# Patient Record
Sex: Female | Born: 1950
Health system: Southern US, Community
[De-identification: ages and names within clinical notes are randomized; demographics above are authoritative.]

## PROBLEM LIST (undated history)

## (undated) DIAGNOSIS — R112 Nausea with vomiting, unspecified: Secondary | ICD-10-CM

## (undated) DIAGNOSIS — M1990 Unspecified osteoarthritis, unspecified site: Secondary | ICD-10-CM

## (undated) DIAGNOSIS — T8859XA Other complications of anesthesia, initial encounter: Secondary | ICD-10-CM

## (undated) DIAGNOSIS — G4700 Insomnia, unspecified: Secondary | ICD-10-CM

## (undated) DIAGNOSIS — R519 Headache, unspecified: Secondary | ICD-10-CM

## (undated) DIAGNOSIS — T4145XA Adverse effect of unspecified anesthetic, initial encounter: Secondary | ICD-10-CM

## (undated) DIAGNOSIS — C801 Malignant (primary) neoplasm, unspecified: Secondary | ICD-10-CM

## (undated) DIAGNOSIS — Z98811 Dental restoration status: Secondary | ICD-10-CM

## (undated) DIAGNOSIS — R51 Headache: Secondary | ICD-10-CM

## (undated) DIAGNOSIS — Z9889 Other specified postprocedural states: Secondary | ICD-10-CM

## (undated) DIAGNOSIS — E039 Hypothyroidism, unspecified: Secondary | ICD-10-CM

## (undated) HISTORY — PX: VAGINAL HYSTERECTOMY: SUR661

## (undated) HISTORY — PX: DIAGNOSTIC LAPAROSCOPY: SUR761

## (undated) HISTORY — PX: TONSILLECTOMY: SUR1361

## (undated) HISTORY — PX: BREAST SURGERY: SHX581

## (undated) HISTORY — PX: TUBAL LIGATION: SHX77

## (undated) HISTORY — PX: APPENDECTOMY: SHX54

---

## 1982-04-08 HISTORY — PX: CERVICAL CONE BIOPSY: SUR198

## 1983-04-09 HISTORY — PX: VEIN LIGATION AND STRIPPING: SHX2653

## 2004-01-28 ENCOUNTER — Ambulatory Visit: Payer: Self-pay | Admitting: Internal Medicine

## 2005-12-31 ENCOUNTER — Ambulatory Visit: Payer: Self-pay | Admitting: General Surgery

## 2006-04-08 HISTORY — PX: BREAST EXCISIONAL BIOPSY: SUR124

## 2008-05-19 ENCOUNTER — Emergency Department: Payer: Self-pay | Admitting: Emergency Medicine

## 2009-12-09 ENCOUNTER — Emergency Department: Payer: Self-pay | Admitting: Emergency Medicine

## 2011-09-25 ENCOUNTER — Ambulatory Visit: Payer: Self-pay | Admitting: Family Medicine

## 2012-11-03 ENCOUNTER — Ambulatory Visit: Payer: Self-pay | Admitting: Family Medicine

## 2014-02-11 ENCOUNTER — Ambulatory Visit: Payer: Self-pay | Admitting: Family Medicine

## 2015-01-03 ENCOUNTER — Encounter: Payer: Self-pay | Admitting: Family Medicine

## 2015-01-05 ENCOUNTER — Other Ambulatory Visit: Payer: Self-pay | Admitting: Internal Medicine

## 2015-01-05 DIAGNOSIS — Z1239 Encounter for other screening for malignant neoplasm of breast: Secondary | ICD-10-CM

## 2015-02-13 ENCOUNTER — Ambulatory Visit: Payer: Self-pay

## 2015-02-15 ENCOUNTER — Ambulatory Visit
Admission: RE | Admit: 2015-02-15 | Discharge: 2015-02-15 | Disposition: A | Discharge status: Home / Self Care | Payer: Medicare Other | Source: Ambulatory Visit | Attending: Internal Medicine | Admitting: Internal Medicine

## 2015-02-15 ENCOUNTER — Other Ambulatory Visit: Payer: Self-pay | Admitting: Internal Medicine

## 2015-02-15 DIAGNOSIS — Z1239 Encounter for other screening for malignant neoplasm of breast: Secondary | ICD-10-CM

## 2015-02-15 DIAGNOSIS — Z1231 Encounter for screening mammogram for malignant neoplasm of breast: Secondary | ICD-10-CM | POA: Diagnosis not present

## 2015-03-22 ENCOUNTER — Encounter: Payer: Self-pay | Admitting: *Deleted

## 2015-03-23 ENCOUNTER — Ambulatory Visit
Admission: RE | Admit: 2015-03-23 | Discharge: 2015-03-23 | Disposition: A | Discharge status: Home / Self Care | Payer: Medicare Other | Source: Ambulatory Visit | Attending: Gastroenterology | Admitting: Gastroenterology

## 2015-03-23 ENCOUNTER — Encounter: Payer: Self-pay | Admitting: *Deleted

## 2015-03-23 ENCOUNTER — Ambulatory Visit: Payer: Medicare Other | Admitting: Certified Registered Nurse Anesthetist

## 2015-03-23 ENCOUNTER — Encounter
Admission: RE | Disposition: A | Discharge status: Home / Self Care | Payer: Self-pay | Source: Ambulatory Visit | Attending: Gastroenterology

## 2015-03-23 DIAGNOSIS — Z7982 Long term (current) use of aspirin: Secondary | ICD-10-CM | POA: Insufficient documentation

## 2015-03-23 DIAGNOSIS — Z1211 Encounter for screening for malignant neoplasm of colon: Secondary | ICD-10-CM | POA: Diagnosis not present

## 2015-03-23 DIAGNOSIS — Z79899 Other long term (current) drug therapy: Secondary | ICD-10-CM | POA: Diagnosis not present

## 2015-03-23 DIAGNOSIS — E039 Hypothyroidism, unspecified: Secondary | ICD-10-CM | POA: Diagnosis not present

## 2015-03-23 DIAGNOSIS — Z8371 Family history of colonic polyps: Secondary | ICD-10-CM | POA: Diagnosis not present

## 2015-03-23 HISTORY — DX: Hypothyroidism, unspecified: E03.9

## 2015-03-23 HISTORY — DX: Malignant (primary) neoplasm, unspecified: C80.1

## 2015-03-23 HISTORY — PX: COLONOSCOPY WITH PROPOFOL: SHX5780

## 2015-03-23 SURGERY — COLONOSCOPY WITH PROPOFOL
Anesthesia: General

## 2015-03-23 MED ORDER — PROPOFOL 10 MG/ML IV BOLUS
INTRAVENOUS | Status: DC | PRN
  Administered 2015-03-23: 10 mg via INTRAVENOUS
  Administered 2015-03-23 (×2): 20 mg via INTRAVENOUS
  Administered 2015-03-23 (×3): 10 mg via INTRAVENOUS

## 2015-03-23 MED ORDER — MIDAZOLAM HCL 2 MG/2ML IJ SOLN
INTRAMUSCULAR | Status: DC | PRN
  Administered 2015-03-23: 1 mg via INTRAVENOUS

## 2015-03-23 MED ORDER — PROPOFOL 500 MG/50ML IV EMUL
INTRAVENOUS | Status: DC | PRN
  Administered 2015-03-23: 120 ug/kg/min via INTRAVENOUS

## 2015-03-23 MED ORDER — SODIUM CHLORIDE 0.9 % IV SOLN
INTRAVENOUS | Status: DC
Start: 2015-03-23 — End: 2015-03-25
  Administered 2015-03-23: 09:00:00 via INTRAVENOUS

## 2015-03-23 MED ORDER — SODIUM CHLORIDE 0.9 % IV SOLN: INTRAVENOUS | Status: DC

## 2015-03-23 NOTE — H&P (Signed)
    Primary Care Physician:  Glendon Axe, MD Primary Gastroenterologist:  Dr. Candace Cruise  Pre-Procedure History & Physical: HPI:  Cathy Hunt is a 64 y.o. female is here for an colonoscopy.  Past Medical History  Diagnosis Date  . Cancer ([place])   . Hypothyroidism     Past Surgical History  Procedure Laterality Date  . Diagnostic laparoscopy    . Tonsillectomy    . Tubal ligation    . Appendectomy    . Breast surgery    . Vein ligation and stripping  1985  . Cervical cone biopsy  1984    Prior to Admission medications   Medication Sig Start Date End Date Taking? Authorizing Provider  aspirin 81 MG tablet Take 81 mg by mouth daily.   Yes Historical Provider, MD  levothyroxine (SYNTHROID, LEVOTHROID) 175 MCG tablet Take 175 mcg by mouth daily before breakfast.   Yes Historical Provider, MD    Allergies as of 03/21/2015  . (Not on File)    History reviewed. No pertinent family history.  Social History   Social History  . Marital Status: Married    Spouse Name: N/A  . Number of Children: N/A  . Years of Education: N/A   Occupational History  . Not on file.   Social History Main Topics  . Smoking status: Never Smoker   . Smokeless tobacco: Never Used  . Alcohol Use: No  . Drug Use: No  . Sexual Activity: Not on file   Other Topics Concern  . Not on file   Social History Narrative    Review of Systems: See HPI, otherwise negative ROS  Physical Exam: BP 112/73 mmHg  Pulse 81  Temp(Src) 97.4 F (36.3 C) (Tympanic)  Resp 18  Ht 5\' 7"  (1.702 m)  Wt 61.236 kg (135 lb)  BMI 21.14 kg/m2  SpO2 100% General:   Alert,  pleasant and cooperative in NAD Head:  Normocephalic and atraumatic. Neck:  Supple; no masses or thyromegaly. Lungs:  Clear throughout to auscultation.    Heart:  Regular rate and rhythm. Abdomen:  Soft, nontender and nondistended. Normal bowel sounds, without guarding, and without rebound.   Neurologic:  Alert and  oriented x4;  grossly  normal neurologically.  Impression/Plan: Cathy Hunt is here for an colonoscopy to be performed for family hx of colon polyps Risks, benefits, limitations, and alternatives regarding  colonoscopy have been reviewed with the patient.  Questions have been answered.  All parties agreeable.   Loren Vicens, Lupita Dawn, MD  03/23/2015, 8:48 AM

## 2015-03-23 NOTE — Op Note (Signed)
[facility] Gastroenterology Patient Name: Cathy Hunt Procedure Date: 03/23/2015 9:31 AM MRN: [id number] Account #: [id number] Date of Birth: 02/18/1951 Admit Type: Outpatient Age: 64 Room: [facility] ENDO ROOM 4 Gender: Female Note Status: Finalized Procedure:         Colonoscopy Indications:       Colon cancer screening in patient at increased risk:                     Family history of 1st-degree relative with colon polyps Providers:         Lupita Dawn. Candace Cruise, MD Referring MD:      Glendon Axe (Referring MD) Medicines:         Monitored Anesthesia Care Complications:     No immediate complications. Procedure:         Pre-Anesthesia Assessment:                    - Prior to the procedure, a History and Physical was                     performed, and patient medications, allergies and                     sensitivities were reviewed. The patient's tolerance of                     previous anesthesia was reviewed.                    - The risks and benefits of the procedure and the sedation                     options and risks were discussed with the patient. All                     questions were answered and informed consent was obtained.                    - After reviewing the risks and benefits, the patient was                     deemed in satisfactory condition to undergo the procedure.                    After obtaining informed consent, the colonoscope was                     passed under direct vision. Throughout the procedure, the                     patient's blood pressure, pulse, and oxygen saturations                     were monitored continuously. The Olympus CF-Q160AL                     colonoscope (S#. [id number]) was introduced through the anus                     and advanced to the the cecum, identified by appendiceal                     orifice and ileocecal valve. The patient tolerated the  procedure well. The quality of the bowel  preparation was                     fair. The colonoscopy was performed with difficulty due to                     a tortuous colon. Successful completion of the procedure                     was aided by using manual pressure. Findings:      The colon (entire examined portion) appeared normal. Impression:        - The entire examined colon is normal.                    - No specimens collected. Recommendation:    - Discharge patient to home.                    - Repeat colonoscopy in 5 years for surveillance.                    - The findings and recommendations were discussed with the                     patient. Procedure Code(s): --- Professional ---                    [phone], Colonoscopy, flexible; diagnostic, including                     collection of specimen(s) by brushing or washing, when                     performed (separate procedure) Diagnosis Code(s): --- Professional ---                    Z83.71, Family history of colonic polyps CPT copyright 2014 American Medical Association. All rights reserved. The codes documented in this report are preliminary and upon coder review may  be revised to meet current compliance requirements. Hulen Luster, MD 03/23/2015 9:54:25 AM This report has been signed electronically. Number of Addenda: 0 Note Initiated On: 03/23/2015 9:31 AM Scope Withdrawal Time: 0 hours 7 minutes 18 seconds  Total Procedure Duration: 0 hours 16 minutes 52 seconds       [facility]

## 2015-03-23 NOTE — OR Nursing (Signed)
Pt c/o abdominal pain. 4 on scale of 1-10. Dr Candace Cruise aware. Reassure pt that is was proably 'gas' from procedure.. 10;38 Pain resolved. 2 on scale of 1-10.

## 2015-03-23 NOTE — Anesthesia Postprocedure Evaluation (Signed)
Anesthesia Post Note  Patient: Cathy Hunt  Procedure(s) Performed: Procedure(s) (LRB): COLONOSCOPY WITH PROPOFOL (N/A)  Patient location during evaluation: PACU Anesthesia Type: General Level of consciousness: awake and alert Pain management: satisfactory to patient Vital Signs Assessment: post-procedure vital signs reviewed and stable Respiratory status: respiratory function stable Cardiovascular status: stable Anesthetic complications: no    Last Vitals:  Filed Vitals:   03/23/15 0841 03/23/15 0956  BP: 112/73 122/75  Pulse: 81 75  Temp: 36.3 C   Resp: 18 21    Last Pain: There were no vitals filed for this visit.               VAN STAVEREN,Aicha Clingenpeel

## 2015-03-23 NOTE — Anesthesia Procedure Notes (Signed)
Date/Time: 03/23/2015 9:33 AM Performed by: Johnna Acosta Pre-anesthesia Checklist: Patient identified, Emergency Drugs available, Suction available and Patient being monitored Patient Re-evaluated:Patient Re-evaluated prior to inductionOxygen Delivery Method: Nasal cannula

## 2015-03-23 NOTE — Anesthesia Preprocedure Evaluation (Signed)
Anesthesia Evaluation  Patient identified by MRN, date of birth, ID band Patient awake    Reviewed: Allergy & Precautions, NPO status   Airway Mallampati: II       Dental  (+) Caps   Pulmonary neg pulmonary ROS,    breath sounds clear to auscultation       Cardiovascular Exercise Tolerance: Good  Rhythm:Regular Rate:Normal     Neuro/Psych negative neurological ROS     GI/Hepatic negative GI ROS, Neg liver ROS,   Endo/Other  Hypothyroidism   Renal/GU negative Renal ROS     Musculoskeletal   Abdominal Normal abdominal exam  (+)   Peds  Hematology   Anesthesia Other Findings   Reproductive/Obstetrics                             Anesthesia Physical Anesthesia Plan  ASA: II  Anesthesia Plan: General   Post-op Pain Management:    Induction: Intravenous  Airway Management Planned: Nasal Cannula  Additional Equipment:   Intra-op Plan:   Post-operative Plan:   Informed Consent: I have reviewed the patients History and Physical, chart, labs and discussed the procedure including the risks, benefits and alternatives for the proposed anesthesia with the patient or authorized representative who has indicated his/her understanding and acceptance.     Plan Discussed with: CRNA  Anesthesia Plan Comments:         Anesthesia Quick Evaluation

## 2015-03-23 NOTE — Transfer of Care (Cosign Needed)
Immediate Anesthesia Transfer of Care Note  Patient: Cathy Hunt  Procedure(s) Performed: Procedure(s): COLONOSCOPY WITH PROPOFOL (N/A)  Patient Location: PACU  Anesthesia Type:General  Level of Consciousness: sedated  Airway & Oxygen Therapy: Patient Spontanous Breathing and Patient connected to face mask oxygen  Post-op Assessment: Report given to RN and Post -op Vital signs reviewed and stable  Post vital signs: Reviewed and stable  Last Vitals:  Filed Vitals:   03/23/15 0841 03/23/15 0956  BP: 112/73 122/75  Pulse: 81 75  Temp: 36.3 C   Resp: 18 21    Complications: No apparent anesthesia complications

## 2015-03-25 ENCOUNTER — Encounter: Payer: Self-pay | Admitting: Gastroenterology

## 2015-04-13 DIAGNOSIS — G8929 Other chronic pain: Secondary | ICD-10-CM | POA: Diagnosis not present

## 2015-04-13 DIAGNOSIS — M25512 Pain in left shoulder: Secondary | ICD-10-CM | POA: Diagnosis not present

## 2015-04-20 DIAGNOSIS — G8929 Other chronic pain: Secondary | ICD-10-CM | POA: Diagnosis not present

## 2015-04-20 DIAGNOSIS — M25512 Pain in left shoulder: Secondary | ICD-10-CM | POA: Diagnosis not present

## 2015-05-23 DIAGNOSIS — E039 Hypothyroidism, unspecified: Secondary | ICD-10-CM | POA: Diagnosis not present

## 2015-05-30 DIAGNOSIS — R5383 Other fatigue: Secondary | ICD-10-CM | POA: Diagnosis not present

## 2015-05-30 DIAGNOSIS — E039 Hypothyroidism, unspecified: Secondary | ICD-10-CM | POA: Diagnosis not present

## 2015-07-06 DIAGNOSIS — S46812A Strain of other muscles, fascia and tendons at shoulder and upper arm level, left arm, initial encounter: Secondary | ICD-10-CM | POA: Diagnosis not present

## 2015-07-06 DIAGNOSIS — R87610 Atypical squamous cells of undetermined significance on cytologic smear of cervix (ASC-US): Secondary | ICD-10-CM | POA: Diagnosis not present

## 2015-07-06 DIAGNOSIS — R87810 Cervical high risk human papillomavirus (HPV) DNA test positive: Secondary | ICD-10-CM | POA: Diagnosis not present

## 2015-07-13 DIAGNOSIS — N870 Mild cervical dysplasia: Secondary | ICD-10-CM | POA: Diagnosis not present

## 2015-07-13 DIAGNOSIS — R87810 Cervical high risk human papillomavirus (HPV) DNA test positive: Secondary | ICD-10-CM | POA: Diagnosis not present

## 2015-07-13 DIAGNOSIS — R87610 Atypical squamous cells of undetermined significance on cytologic smear of cervix (ASC-US): Secondary | ICD-10-CM | POA: Diagnosis not present

## 2015-07-24 DIAGNOSIS — N879 Dysplasia of cervix uteri, unspecified: Secondary | ICD-10-CM | POA: Diagnosis not present

## 2015-08-09 NOTE — H&P (Signed)
Ms. Gladieux is a 65 y.o. female here TVH and BSO  consult from Dr Candiss Norse for ASCUS with + HR HPV . Pt had a repeat pap 6 months after and still had the same . Pt is s/p CKC In 90's , with minor abnormal pap subsequent . . S/p SVD . G2P1 No PMB   repeat colposcopic exam 07/2015 showed  LGSIL in the ECC. Given an inadequate colposcpoic exam  I have recommended an definitive procedure.     Past Medical History:  has a past medical history of Cervical cancer (CMS-HCC); Hypothyroid, unspecified; Insomnia; Iron deficiency anemia; and Migraines.  Past Surgical History:  has a past surgical history that includes Laparoscopic tubal ligation; Tonsillectomy (1953); Appendectomy (1957); Cervical cone biopsy (1984); vein stripping (1985); excision of breast (Right, 2008); Appendectomy; Tubal ligation; Colonoscopy (01/28/2004); and Colonoscopy (03/23/2015). Family History: family history includes Colon polyps in her father; Heart attack in her father; Hypothyroidism in her daughter; No Known Problems in her mother. Social History:  reports that she has never smoked. She has never used smokeless tobacco. She reports that she drinks alcohol. She reports that she does not use illicit drugs. OB/GYN History:  OB History    Gravida Para Term Preterm AB TAB SAB Ectopic Multiple Living   2 1 1  1  1   1       Allergies: is allergic to codeine; penicillin; and phenergan [promethazine]. Medications:  Current Outpatient Prescriptions:  . aspirin 81 MG EC tablet, Take 81 mg by mouth once daily., Disp: , Rfl:  . CALCIUM CARB/VITAMIN D3/VIT K1 (CALCIUM SOFT CHEW ORAL), Take 2 capsules by mouth once daily., Disp: , Rfl:  . levothyroxine (SYNTHROID, LEVOTHROID) 175 MCG tablet, Take 1 tablet (175 mcg total) by mouth once daily. Take on an empty stomach with a glass of water at least 30-60 minutes before breakfast., Disp: 90 tablet, Rfl: 3 . meloxicam (MOBIC) 7.5 MG tablet, Take 1 tablet (7.5 mg total) by mouth once  daily., Disp: 30 tablet, Rfl: 3 . polyethylene glycol (MIRALAX) powder, Take as directed for colonoscopy prep., Disp: 255 g, Rfl: 0 . predniSONE (DELTASONE) 20 MG tablet, Take two tablets daily for 3 days then one tablet daily for 4 days., Disp: 10 tablet, Rfl: 0 . tiZANidine (ZANAFLEX) 2 MG tablet, Take 1 tablet (2 mg total) by mouth nightly as needed for Muscle spasms., Disp: 30 tablet, Rfl: 3  Review of Systems: General:   No fatigue or weight loss Eyes:   No vision changes Ears:   No hearing difficulty Respiratory:   No cough or shortness of breath Pulmonary:   No asthma or shortness of breath Cardiovascular:  No chest pain, palpitations, dyspnea on exertion Gastrointestinal:  No abdominal bloating, chronic diarrhea, constipations, masses, pain or hematochezia Genitourinary:  No hematuria, dysuria, abnormal vaginal discharge, pelvic pain, Menometrorrhagia Lymphatic:  No swollen lymph nodes Musculoskeletal: No muscle weakness Neurologic:  No extremity weakness, syncope, seizure disorder Psychiatric:  No history of depression, delusions or suicidal/homicidal ideation   Exam:   Vitals:   08/15/2015  BP: 122/77  Pulse: 78    Body mass index is 21.47 kg/(m^2).  WDWN white/ female in NAD  Lungs: CTA  CV : RRR without murmur   Abdomen: soft , no mass, normal active bowel sounds, non-tender, no rebound tenderness Pelvic: tanner stage 5 ,  External genitalia: vulva /labia no lesions Urethra: no prolapse Vagina: normal physiologic d/c, adequate room for vaginal hyst if need be  Cervix: no lesions,  no cervical motion tenderness  Uterus: normal size shape and contour, non-tender Adnexa: no mass, non-tender   Impression:   The encounter diagnosis recurrent / persistent cervical dysplasia with an inadequate colposcopic exam . + ECC with LGSIL   Plan:   TVH + BSO if ovaries can be easily accessible at the time of hysterectomy        Laityn Bensen Samuel Germany, MD

## 2015-08-15 ENCOUNTER — Encounter
Admission: RE | Admit: 2015-08-15 | Discharge: 2015-08-15 | Disposition: A | Discharge status: Home / Self Care | Payer: PPO | Source: Ambulatory Visit | Attending: Obstetrics and Gynecology | Admitting: Obstetrics and Gynecology

## 2015-08-15 DIAGNOSIS — Z01812 Encounter for preprocedural laboratory examination: Secondary | ICD-10-CM | POA: Diagnosis not present

## 2015-08-15 DIAGNOSIS — Z01810 Encounter for preprocedural cardiovascular examination: Secondary | ICD-10-CM | POA: Insufficient documentation

## 2015-08-15 HISTORY — DX: Other complications of anesthesia, initial encounter: T88.59XA

## 2015-08-15 HISTORY — DX: Other specified postprocedural states: Z98.890

## 2015-08-15 HISTORY — DX: Headache: R51

## 2015-08-15 HISTORY — DX: Nausea with vomiting, unspecified: R11.2

## 2015-08-15 HISTORY — DX: Unspecified osteoarthritis, unspecified site: M19.90

## 2015-08-15 HISTORY — DX: Headache, unspecified: R51.9

## 2015-08-15 HISTORY — DX: Adverse effect of unspecified anesthetic, initial encounter: T41.45XA

## 2015-08-15 HISTORY — DX: Insomnia, unspecified: G47.00

## 2015-08-15 LAB — ABO/RH: ABO/RH(D): A NEG

## 2015-08-15 LAB — CBC
HEMATOCRIT: 42.2 % (ref 35.0–47.0)
HEMOGLOBIN: 14.2 g/dL (ref 12.0–16.0)
MCH: 32.3 pg (ref 26.0–34.0)
MCHC: 33.6 g/dL (ref 32.0–36.0)
MCV: 96.2 fL (ref 80.0–100.0)
Platelets: 228 10*3/uL (ref 150–440)
RBC: 4.38 MIL/uL (ref 3.80–5.20)
RDW: 12.9 % (ref 11.5–14.5)
WBC: 7.8 10*3/uL (ref 3.6–11.0)

## 2015-08-15 LAB — BASIC METABOLIC PANEL
ANION GAP: 9 (ref 5–15)
BUN: 16 mg/dL (ref 6–20)
CO2: 27 mmol/L (ref 22–32)
Calcium: 9.8 mg/dL (ref 8.9–10.3)
Chloride: 101 mmol/L (ref 101–111)
Creatinine, Ser: 0.72 mg/dL (ref 0.44–1.00)
GLUCOSE: 99 mg/dL (ref 65–99)
POTASSIUM: 4.3 mmol/L (ref 3.5–5.1)
Sodium: 137 mmol/L (ref 135–145)

## 2015-08-15 LAB — TYPE AND SCREEN
ABO/RH(D): A NEG
ANTIBODY SCREEN: NEGATIVE

## 2015-08-15 NOTE — Patient Instructions (Signed)
  Your procedure is scheduled on: Aug 21, 2015 (Monday) Report to Day Surgery.([facility]) SECOND FLOOR To find out your arrival time please call [phone] between 1PM - 3PM on Aug 18, 2015 (Friday).  Remember: Instructions that are not followed completely may result in serious medical risk, up to and including death, or upon the discretion of your surgeon and anesthesiologist your surgery may need to be rescheduled.    __x__ 1. Do not eat food or drink liquids after midnight. No gum chewing or hard candies.     __x__ 2. No Alcohol for 24 hours before or after surgery.   ____ 3. Bring all medications with you on the day of surgery if instructed.    __x__ 4. Notify your doctor if there is any change in your medical condition     (cold, fever, infections).     Do not wear jewelry, make-up, hairpins, clips or nail polish.  Do not wear lotions, powders, or perfumes. You may wear deodorant.  Do not shave 48 hours prior to surgery. Men may shave face and neck.  Do not bring valuables to the hospital.    [facility] is not responsible for any belongings or valuables.               Contacts, dentures or bridgework may not be worn into surgery.  Leave your suitcase in the car. After surgery it may be brought to your room.  For patients admitted to the hospital, discharge time is determined by your                treatment team.   Patients discharged the day of surgery will not be allowed to drive home.   Please read over the following fact sheets that you were given:   Surgical Site Infection Prevention   __x__ Take these medicines the morning of surgery with A SIP OF WATER:    1. Levothyroxine  2.   3.   4.  5.  6.  ____ Fleet Enema (as directed)   _x___ Use CHG Soap as directed  ____ Use inhalers on the day of surgery  ____ Stop metformin 2 days prior to surgery    ____ Take 1/2 of usual insulin dose the night before surgery and none on the morning of surgery.   __x__  Stop Coumadin/Plavix/aspirin on (PATIENT HAS STOPPED ASPIRIN)  __X__ Stop Anti-inflammatories on (STOP EXCEDRIN, AND MELOXICAM NOW)   ____ Stop supplements until after surgery.    ____ Bring C-Pap to the hospital.

## 2015-08-21 ENCOUNTER — Observation Stay
Admission: RE | Admit: 2015-08-21 | Discharge: 2015-08-22 | Disposition: A | Discharge status: Home / Self Care | Payer: PPO | Source: Ambulatory Visit | Attending: Obstetrics and Gynecology | Admitting: Obstetrics and Gynecology

## 2015-08-21 ENCOUNTER — Encounter
Admission: RE | Disposition: A | Discharge status: Home / Self Care | Payer: Self-pay | Source: Ambulatory Visit | Attending: Obstetrics and Gynecology

## 2015-08-21 ENCOUNTER — Encounter: Payer: Self-pay | Admitting: Emergency Medicine

## 2015-08-21 ENCOUNTER — Inpatient Hospital Stay: Payer: PPO | Admitting: Anesthesiology

## 2015-08-21 DIAGNOSIS — Z8349 Family history of other endocrine, nutritional and metabolic diseases: Secondary | ICD-10-CM | POA: Insufficient documentation

## 2015-08-21 DIAGNOSIS — Z9851 Tubal ligation status: Secondary | ICD-10-CM | POA: Diagnosis not present

## 2015-08-21 DIAGNOSIS — Z8249 Family history of ischemic heart disease and other diseases of the circulatory system: Secondary | ICD-10-CM | POA: Diagnosis not present

## 2015-08-21 DIAGNOSIS — Z885 Allergy status to narcotic agent status: Secondary | ICD-10-CM | POA: Insufficient documentation

## 2015-08-21 DIAGNOSIS — G4700 Insomnia, unspecified: Secondary | ICD-10-CM | POA: Insufficient documentation

## 2015-08-21 DIAGNOSIS — E039 Hypothyroidism, unspecified: Secondary | ICD-10-CM | POA: Diagnosis not present

## 2015-08-21 DIAGNOSIS — Z79899 Other long term (current) drug therapy: Secondary | ICD-10-CM | POA: Insufficient documentation

## 2015-08-21 DIAGNOSIS — N763 Subacute and chronic vulvitis: Secondary | ICD-10-CM | POA: Diagnosis not present

## 2015-08-21 DIAGNOSIS — Z8371 Family history of colonic polyps: Secondary | ICD-10-CM | POA: Insufficient documentation

## 2015-08-21 DIAGNOSIS — Z7982 Long term (current) use of aspirin: Secondary | ICD-10-CM | POA: Diagnosis not present

## 2015-08-21 DIAGNOSIS — Z9889 Other specified postprocedural states: Secondary | ICD-10-CM

## 2015-08-21 DIAGNOSIS — N879 Dysplasia of cervix uteri, unspecified: Secondary | ICD-10-CM | POA: Diagnosis not present

## 2015-08-21 DIAGNOSIS — Z79891 Long term (current) use of opiate analgesic: Secondary | ICD-10-CM | POA: Diagnosis not present

## 2015-08-21 DIAGNOSIS — Z9049 Acquired absence of other specified parts of digestive tract: Secondary | ICD-10-CM | POA: Diagnosis not present

## 2015-08-21 DIAGNOSIS — Z8541 Personal history of malignant neoplasm of cervix uteri: Secondary | ICD-10-CM | POA: Insufficient documentation

## 2015-08-21 DIAGNOSIS — Z888 Allergy status to other drugs, medicaments and biological substances status: Secondary | ICD-10-CM | POA: Insufficient documentation

## 2015-08-21 DIAGNOSIS — R87612 Low grade squamous intraepithelial lesion on cytologic smear of cervix (LGSIL): Secondary | ICD-10-CM | POA: Diagnosis not present

## 2015-08-21 DIAGNOSIS — N870 Mild cervical dysplasia: Secondary | ICD-10-CM | POA: Diagnosis not present

## 2015-08-21 DIAGNOSIS — N736 Female pelvic peritoneal adhesions (postinfective): Secondary | ICD-10-CM | POA: Diagnosis not present

## 2015-08-21 DIAGNOSIS — Z880 Allergy status to penicillin: Secondary | ICD-10-CM | POA: Diagnosis not present

## 2015-08-21 DIAGNOSIS — N840 Polyp of corpus uteri: Secondary | ICD-10-CM | POA: Diagnosis not present

## 2015-08-21 DIAGNOSIS — Z791 Long term (current) use of non-steroidal anti-inflammatories (NSAID): Secondary | ICD-10-CM | POA: Diagnosis not present

## 2015-08-21 DIAGNOSIS — N858 Other specified noninflammatory disorders of uterus: Secondary | ICD-10-CM | POA: Diagnosis not present

## 2015-08-21 HISTORY — PX: VAGINAL HYSTERECTOMY: SHX2639

## 2015-08-21 HISTORY — PX: BILATERAL SALPINGECTOMY: SHX5743

## 2015-08-21 LAB — POCT PREGNANCY, URINE: PREG TEST UR: NEGATIVE

## 2015-08-21 SURGERY — HYSTERECTOMY, VAGINAL
Anesthesia: General

## 2015-08-21 MED ORDER — HYDROMORPHONE HCL 1 MG/ML IJ SOLN
INTRAMUSCULAR | Status: DC | PRN
  Administered 2015-08-21: 1 mg via INTRAVENOUS

## 2015-08-21 MED ORDER — KETOROLAC TROMETHAMINE 30 MG/ML IJ SOLN
30.0000 mg | Freq: Three times a day (TID) | INTRAMUSCULAR | Status: DC | PRN
Start: 2015-08-21 — End: 2015-08-22
  Administered 2015-08-21 – 2015-08-22 (×3): 30 mg via INTRAVENOUS
  Filled 2015-08-21 (×3): qty 1

## 2015-08-21 MED ORDER — ACETAMINOPHEN 10 MG/ML IV SOLN
INTRAVENOUS | Status: AC
  Filled 2015-08-21: qty 100

## 2015-08-21 MED ORDER — SUGAMMADEX SODIUM 200 MG/2ML IV SOLN
INTRAVENOUS | Status: DC | PRN
Start: 2015-08-21 — End: 2015-08-21
  Administered 2015-08-21: 120 mg via INTRAVENOUS

## 2015-08-21 MED ORDER — ONDANSETRON HCL 4 MG/2ML IJ SOLN
4.0000 mg | Freq: Four times a day (QID) | INTRAMUSCULAR | Status: DC | PRN
  Administered 2015-08-21 – 2015-08-22 (×2): 4 mg via INTRAVENOUS
  Filled 2015-08-21 (×2): qty 2

## 2015-08-21 MED ORDER — ONDANSETRON HCL 4 MG PO TABS: 4.0000 mg | ORAL_TABLET | Freq: Four times a day (QID) | ORAL | Status: DC | PRN

## 2015-08-21 MED ORDER — FAMOTIDINE 20 MG PO TABS
20.0000 mg | ORAL_TABLET | Freq: Once | ORAL | Status: AC
  Administered 2015-08-21: 20 mg via ORAL

## 2015-08-21 MED ORDER — MEPERIDINE HCL 25 MG/ML IJ SOLN: 75.0000 mg | INTRAMUSCULAR | Status: DC | PRN

## 2015-08-21 MED ORDER — ROCURONIUM BROMIDE 100 MG/10ML IV SOLN
INTRAVENOUS | Status: DC | PRN
  Administered 2015-08-21: 50 mg via INTRAVENOUS

## 2015-08-21 MED ORDER — LIDOCAINE-EPINEPHRINE 1 %-1:100000 IJ SOLN
INTRAMUSCULAR | Status: AC
  Filled 2015-08-21: qty 1

## 2015-08-21 MED ORDER — PHENYLEPHRINE HCL 10 MG/ML IJ SOLN
INTRAMUSCULAR | Status: DC | PRN
  Administered 2015-08-21: 100 ug via INTRAVENOUS
  Administered 2015-08-21: 300 ug via INTRAVENOUS

## 2015-08-21 MED ORDER — CEFOXITIN SODIUM-DEXTROSE 2-2.2 GM-% IV SOLR (PREMIX)
2.0000 g | INTRAVENOUS | Status: AC
  Administered 2015-08-21: 2 g via INTRAVENOUS

## 2015-08-21 MED ORDER — LACTATED RINGERS IV SOLN
INTRAVENOUS | Status: DC
  Administered 2015-08-21 – 2015-08-22 (×3): via INTRAVENOUS

## 2015-08-21 MED ORDER — ONDANSETRON HCL 4 MG/2ML IJ SOLN
INTRAMUSCULAR | Status: DC | PRN
  Administered 2015-08-21: 4 mg via INTRAVENOUS

## 2015-08-21 MED ORDER — SCOPOLAMINE 1 MG/3DAYS TD PT72
1.0000 | MEDICATED_PATCH | TRANSDERMAL | Status: DC
  Administered 2015-08-21: 1.5 mg via TRANSDERMAL

## 2015-08-21 MED ORDER — DEXAMETHASONE SODIUM PHOSPHATE 4 MG/ML IJ SOLN
INTRAMUSCULAR | Status: DC | PRN
  Administered 2015-08-21: 5 mg via INTRAVENOUS

## 2015-08-21 MED ORDER — PROPOFOL 10 MG/ML IV BOLUS
INTRAVENOUS | Status: DC | PRN
  Administered 2015-08-21: 120 mg via INTRAVENOUS

## 2015-08-21 MED ORDER — LACTATED RINGERS IV SOLN
INTRAVENOUS | Status: DC
  Administered 2015-08-21 (×2): via INTRAVENOUS

## 2015-08-21 MED ORDER — FENTANYL CITRATE (PF) 100 MCG/2ML IJ SOLN
INTRAMUSCULAR | Status: DC | PRN
  Administered 2015-08-21: 200 ug via INTRAVENOUS
  Administered 2015-08-21: 50 ug via INTRAVENOUS

## 2015-08-21 MED ORDER — ACETAMINOPHEN 10 MG/ML IV SOLN
INTRAVENOUS | Status: DC | PRN
  Administered 2015-08-21: 1000 mg via INTRAVENOUS

## 2015-08-21 MED ORDER — FENTANYL CITRATE (PF) 100 MCG/2ML IJ SOLN
INTRAMUSCULAR | Status: AC
  Administered 2015-08-21: 25 ug via INTRAVENOUS
  Filled 2015-08-21: qty 2

## 2015-08-21 MED ORDER — LIDOCAINE HCL (CARDIAC) 20 MG/ML IV SOLN
INTRAVENOUS | Status: DC | PRN
  Administered 2015-08-21: 100 mg via INTRAVENOUS

## 2015-08-21 MED ORDER — MEPERIDINE HCL 50 MG PO TABS: 100.0000 mg | ORAL_TABLET | ORAL | Status: DC | PRN

## 2015-08-21 MED ORDER — ONDANSETRON HCL 4 MG/2ML IJ SOLN: 4.0000 mg | Freq: Once | INTRAMUSCULAR | Status: DC | PRN

## 2015-08-21 MED ORDER — CEFOXITIN SODIUM-DEXTROSE 2-2.2 GM-% IV SOLR (PREMIX)
INTRAVENOUS | Status: AC
  Filled 2015-08-21: qty 50

## 2015-08-21 MED ORDER — MIDAZOLAM HCL 2 MG/2ML IJ SOLN
INTRAMUSCULAR | Status: DC | PRN
  Administered 2015-08-21: 1 mg via INTRAVENOUS

## 2015-08-21 MED ORDER — LIDOCAINE-EPINEPHRINE 1 %-1:100000 IJ SOLN
INTRAMUSCULAR | Status: DC | PRN
  Administered 2015-08-21: 9 mL

## 2015-08-21 MED ORDER — FENTANYL CITRATE (PF) 100 MCG/2ML IJ SOLN
25.0000 ug | INTRAMUSCULAR | Status: DC | PRN
  Administered 2015-08-21 (×4): 25 ug via INTRAVENOUS

## 2015-08-21 MED ORDER — MORPHINE SULFATE (PF) 2 MG/ML IV SOLN
1.0000 mg | INTRAVENOUS | Status: DC | PRN
  Administered 2015-08-21 (×3): 2 mg via INTRAVENOUS
  Filled 2015-08-21 (×2): qty 1

## 2015-08-21 MED ORDER — MORPHINE SULFATE (PF) 2 MG/ML IV SOLN
INTRAVENOUS | Status: AC
  Filled 2015-08-21: qty 1

## 2015-08-21 MED ORDER — FAMOTIDINE 20 MG PO TABS
ORAL_TABLET | ORAL | Status: AC
  Administered 2015-08-21: 20 mg via ORAL
  Filled 2015-08-21: qty 1

## 2015-08-21 SURGICAL SUPPLY — 30 items
BAG URO DRAIN 2000ML W/SPOUT (MISCELLANEOUS) ×4 IMPLANT
CANISTER SUCT 1200ML W/VALVE (MISCELLANEOUS) ×4 IMPLANT
CATH FOLEY 2WAY  5CC 16FR (CATHETERS) ×2
CATH ROBINSON RED A/P 16FR (CATHETERS) IMPLANT
CATH URTH 16FR FL 2W BLN LF (CATHETERS) ×6 IMPLANT
DRAPE PERI LITHO V/GYN (MISCELLANEOUS) ×4 IMPLANT
DRAPE SURG 17X11 SM STRL (DRAPES) ×4 IMPLANT
DRAPE UNDER BUTTOCK W/FLU (DRAPES) ×4 IMPLANT
ELECT REM PT RETURN 9FT ADLT (ELECTROSURGICAL) ×4
ELECTRODE REM PT RTRN 9FT ADLT (ELECTROSURGICAL) ×3 IMPLANT
GLOVE BIO SURGEON STRL SZ8 (GLOVE) ×32 IMPLANT
GOWN STRL REUS W/ TWL LRG LVL3 (GOWN DISPOSABLE) ×9 IMPLANT
GOWN STRL REUS W/ TWL XL LVL3 (GOWN DISPOSABLE) ×3 IMPLANT
GOWN STRL REUS W/TWL LRG LVL3 (GOWN DISPOSABLE) ×3
GOWN STRL REUS W/TWL XL LVL3 (GOWN DISPOSABLE) ×1
KIT RM TURNOVER CYSTO AR (KITS) ×4 IMPLANT
LABEL OR SOLS (LABEL) ×4 IMPLANT
NDL SAFETY 22GX1.5 (NEEDLE) ×4 IMPLANT
PACK BASIN MINOR ARMC (MISCELLANEOUS) ×4 IMPLANT
PAD OB MATERNITY 4.3X12.25 (PERSONAL CARE ITEMS) ×4 IMPLANT
PAD PREP 24X41 OB/GYN DISP (PERSONAL CARE ITEMS) ×4 IMPLANT
SUT PDS 2-0 27IN (SUTURE) IMPLANT
SUT VIC AB 0 CT1 27 (SUTURE) ×1
SUT VIC AB 0 CT1 27XCR 8 STRN (SUTURE) ×3 IMPLANT
SUT VIC AB 0 CT1 36 (SUTURE) IMPLANT
SUT VIC AB 2-0 SH 27 (SUTURE)
SUT VIC AB 2-0 SH 27XBRD (SUTURE) IMPLANT
SYR CONTROL 10ML (SYRINGE) ×4 IMPLANT
SYRINGE 10CC LL (SYRINGE) ×4 IMPLANT
WATER STERILE IRR 1000ML POUR (IV SOLUTION) ×4 IMPLANT

## 2015-08-21 NOTE — Progress Notes (Signed)
Pt is ready for surgery . TVH + BSO for cx dysplasia . NPO . All questions answered . Labs reviewed . All questions answered

## 2015-08-21 NOTE — Transfer of Care (Signed)
Immediate Anesthesia Transfer of Care Note  Patient: Cathy Hunt  Procedure(s) Performed: Procedure(s): HYSTERECTOMY VAGINAL (N/A) BILATERAL SALPINGECTOMY  Patient Location: PACU  Anesthesia Type:General  Level of Consciousness: sedated and patient cooperative  Airway & Oxygen Therapy: Patient Spontanous Breathing and Patient connected to nasal cannula oxygen  Post-op Assessment: Report given to RN and Post -op Vital signs reviewed and stable  Post vital signs: Reviewed and stable  Last Vitals:  Filed Vitals:   08/21/15 0613  BP: 110/78  Pulse: 72  Temp: 36.6 C  Resp: 20    Last Pain:  Filed Vitals:   08/21/15 0615  PainSc: 4          Complications: No apparent anesthesia complications

## 2015-08-21 NOTE — Progress Notes (Signed)
DOS . Some N/V tonight , but is better now .  VSS Urine output good No blood on pad  A: stable  P: labs in am

## 2015-08-21 NOTE — Anesthesia Procedure Notes (Signed)
Procedure Name: Intubation Date/Time: 08/21/2015 7:46 AM Performed by: Rosaria Ferries, Sloan Takagi Pre-anesthesia Checklist: Patient identified, Emergency Drugs available, Suction available and Patient being monitored Patient Re-evaluated:Patient Re-evaluated prior to inductionOxygen Delivery Method: Circle system utilized Preoxygenation: Pre-oxygenation with 100% oxygen Intubation Type: IV induction Laryngoscope Size: Mac and 3 Grade View: Grade I Tube type: Oral Tube size: 7.0 mm Number of attempts: 1 Intubation method: tooth guard. Placement Confirmation: ETT inserted through vocal cords under direct vision,  positive ETCO2 and breath sounds checked- equal and bilateral Secured at: 21 cm Tube secured with: Tape Dental Injury: Teeth and Oropharynx as per pre-operative assessment

## 2015-08-21 NOTE — Anesthesia Postprocedure Evaluation (Signed)
Anesthesia Post Note  Patient: Cathy Hunt  Procedure(s) Performed: Procedure(s) (LRB): HYSTERECTOMY VAGINAL (N/A) BILATERAL SALPINGECTOMY  Patient location during evaluation: PACU Anesthesia Type: General Level of consciousness: awake and alert Pain management: pain level controlled Vital Signs Assessment: post-procedure vital signs reviewed and stable Respiratory status: spontaneous breathing, nonlabored ventilation, respiratory function stable and patient connected to nasal cannula oxygen Cardiovascular status: blood pressure returned to baseline and stable Postop Assessment: no signs of nausea or vomiting Anesthetic complications: no    Last Vitals:  Filed Vitals:   08/21/15 1147 08/21/15 1249  BP: 118/67 117/68  Pulse: 74 80  Temp: 36.7 C 36.7 C  Resp: 18 16    Last Pain:  Filed Vitals:   08/21/15 1250  PainSc: 5                  Martha Clan

## 2015-08-21 NOTE — Progress Notes (Signed)
Call Dr. Ouida Sills regarding Meperidine order restriction.  Wants to continue PO Meperidine because patient has allergy (drop in BP) to Codeine.

## 2015-08-21 NOTE — Anesthesia Preprocedure Evaluation (Signed)
Anesthesia Evaluation  Patient identified by MRN, date of birth, ID band Patient awake    Reviewed: Allergy & Precautions, H&P , NPO status , Patient's Chart, lab work & pertinent test results, reviewed documented beta blocker date and time   History of Anesthesia Complications (+) PONV and history of anesthetic complications  Airway Mallampati: II  TM Distance: >3 FB Neck ROM: full    Dental no notable dental hx. (+) Teeth Intact   Pulmonary neg pulmonary ROS,    Pulmonary exam normal breath sounds clear to auscultation       Cardiovascular Exercise Tolerance: Good negative cardio ROS Normal cardiovascular exam Rhythm:regular Rate:Normal     Neuro/Psych negative neurological ROS  negative psych ROS   GI/Hepatic negative GI ROS, Neg liver ROS,   Endo/Other  neg diabetesHypothyroidism   Renal/GU negative Renal ROS  negative genitourinary   Musculoskeletal   Abdominal   Peds  Hematology negative hematology ROS (+)   Anesthesia Other Findings Past Medical History:   Hypothyroidism                                               Headache                                                     Arthritis                                                    Cancer (HCC)                                                   Comment:Cervical   Insomnia                                                     Complication of anesthesia                                     Comment:difficulty following verbal commands after vein              ligation surgery   PONV (postoperative nausea and vomiting)                     Reproductive/Obstetrics negative OB ROS                             Anesthesia Physical Anesthesia Plan  ASA: II  Anesthesia Plan: General   Post-op Pain Management:    Induction:   Airway Management Planned:   Additional Equipment:   Intra-op Plan:   Post-operative Plan:    Informed Consent: I have reviewed the patients History  and Physical, chart, labs and discussed the procedure including the risks, benefits and alternatives for the proposed anesthesia with the patient or authorized representative who has indicated his/her understanding and acceptance.   Dental Advisory Given  Plan Discussed with: Anesthesiologist, CRNA and Surgeon  Anesthesia Plan Comments:         Anesthesia Quick Evaluation

## 2015-08-21 NOTE — Brief Op Note (Signed)
08/21/2015  8:57 AM  PATIENT:  Cathy Hunt  65 y.o. female  PRE-OPERATIVE DIAGNOSIS:  Recurrent cervical dysplasia  POST-OPERATIVE DIAGNOSIS:  Recurrent cervical dysplasia  PROCEDURE:  TVH + Bilateral salpingectomy  SURGEON:  Surgeon(s) and Role:    * Boykin Nearing, MD - Primary    * Benjaman Kindler, MD - Assisting  PHYSICIAN ASSISTANT:   ASSISTANTS: none   ANESTHESIA:   general  EBL:   50 cc IOF 1000 cc,  urine 100 cc  BLOOD ADMINISTERED:none  DRAINS: Urinary Catheter (Foley)   LOCAL MEDICATIONS USED:  LIDOCAINE  and Amount: 10 ml  SPECIMEN:  Source of Specimen:  uterus , cervix and bilat fallopian tubes  DISPOSITION OF SPECIMEN:  PATHOLOGY  COUNTS:  YES  TOURNIQUET:  * No tourniquets in log *  DICTATION: .Other Dictation: Dictation Number verbal  PLAN OF CARE: Admit for overnight observation  PATIENT DISPOSITION:  PACU - hemodynamically stable.   Delay start of Pharmacological VTE agent (>24hrs) due to surgical blood loss or risk of bleeding: not applicable

## 2015-08-22 DIAGNOSIS — R87612 Low grade squamous intraepithelial lesion on cytologic smear of cervix (LGSIL): Secondary | ICD-10-CM | POA: Diagnosis not present

## 2015-08-22 LAB — BASIC METABOLIC PANEL
Anion gap: 4 — ABNORMAL LOW (ref 5–15)
BUN: 12 mg/dL (ref 6–20)
CALCIUM: 9.5 mg/dL (ref 8.9–10.3)
CO2: 29 mmol/L (ref 22–32)
CREATININE: 0.62 mg/dL (ref 0.44–1.00)
Chloride: 106 mmol/L (ref 101–111)
GFR calc non Af Amer: >60 mL/min (ref >60)
Glucose, Bld: 111 mg/dL — ABNORMAL HIGH (ref 65–99)
Potassium: 4.7 mmol/L (ref 3.5–5.1)
Sodium: 139 mmol/L (ref 135–145)

## 2015-08-22 LAB — CBC
HEMATOCRIT: 37.6 % (ref 35.0–47.0)
Hemoglobin: 12.8 g/dL (ref 12.0–16.0)
MCH: 32.3 pg (ref 26.0–34.0)
MCHC: 34.1 g/dL (ref 32.0–36.0)
MCV: 94.8 fL (ref 80.0–100.0)
Platelets: 179 10*3/uL (ref 150–440)
RBC: 3.97 MIL/uL (ref 3.80–5.20)
RDW: 13 % (ref 11.5–14.5)
WBC: 10.9 10*3/uL (ref 3.6–11.0)

## 2015-08-22 MED ORDER — DOCUSATE SODIUM 100 MG PO CAPS: 100.0000 mg | ORAL_CAPSULE | Freq: Two times a day (BID) | ORAL | Status: DC

## 2015-08-22 MED ORDER — ONDANSETRON 8 MG PO TBDP: 8.0000 mg | ORAL_TABLET | Freq: Three times a day (TID) | ORAL | Status: DC | PRN

## 2015-08-22 MED ORDER — IBUPROFEN 200 MG PO TABS: ORAL_TABLET | ORAL | Status: DC

## 2015-08-22 MED ORDER — OXYCODONE-ACETAMINOPHEN 5-325 MG PO TABS: 1.0000 | ORAL_TABLET | ORAL | Status: DC | PRN

## 2015-08-22 NOTE — Discharge Instructions (Signed)
.    Supracervical Hysterectomy, Care After Refer to this sheet in the next few weeks. These instructions provide you with information on caring for yourself after your procedure. Your health care provider may also give you more specific instructions. Your treatment has been planned according to current medical practices, but problems sometimes occur. Call your health care provider if you have any problems or questions after your procedure.  WHAT TO EXPECT AFTER THE PROCEDURE After your procedure, it is typical to have some discomfort, tenderness, swelling, and bruising at the surgical sites. This normally lasts for about 2 weeks.  HOME CARE INSTRUCTIONS   Get plenty of rest and sleep.  Only take over-the-counter or prescription medicines as directed by your health care provider.  Do not take aspirin. It can cause bleeding.  Do not drive until your health care provider approves.  Follow your health care provider's advice regarding exercise, lifting, and general activities.  Resume your usual diet as directed by your health care provider.  Do not douche, use tampons, or have sexual intercourse for at least 6 weeks or until your health care provider gives you permission.  Change your bandages (dressings) only as directed by your health care provider.  Monitor your temperature.  Take showers instead of baths for 2-3 weeks or as directed by your health care provider.  Drink enough fluids to keep your urine clear or pale yellow.  Do not drink alcohol until your health care provider gives you permission.  If you are constipated, you may take a mild laxative if your health care provider approves. Bran foods may also help with constipation problems.  Try to have someone home with you for 1-2 weeks to help with activities.  Follow up with your health care provider as directed. SEEK MEDICAL CARE IF:  You have swelling, redness, or increasing pain in the incision area.  You have pus  coming from an incision.  You notice a bad smell coming from the incision or dressing.  You have swelling, redness, or pain in the area around the IV site.  Your incision breaks open.  You feel dizzy or lightheaded.  You have pain or bleeding when you urinate.  You have persistent diarrhea.  You have persistent nausea and vomiting.  You have abnormal vaginal discharge.  You have a rash.  Your pain is not controlled with your prescribed medicine. SEEK IMMEDIATE MEDICAL CARE IF:  You have a fever.  You have severe abdominal pain.  You have chest pain.  You have shortness of breath.  You faint.  You have pain, swelling, or redness in your leg.  You have heavy vaginal bleeding with blood clots.   This information is not intended to replace advice given to you by your health care provider. Make sure you discuss any questions you have with your health care provider.   Hysterectomy Information A hysterectomy is a surgery to remove your uterus. After surgery, you will no longer have periods. Also, you will not be able to get pregnant.  REASONS FOR THIS SURGERY  You have bleeding that is not normal and keeps coming back.  You have lasting (chronic) lower belly (pelvic) pain.  You have a lasting infection.  The lining of your uterus grows outside your uterus.  The lining of your uterus grows in the muscle of your uterus.  Your uterus falls down into your vagina.  You have a growth in your uterus that causes problems.  You have cells that could turn  into cancer (precancerous cells).  You have cancer of the uterus or cervix. TYPES  There are 3 types of hysterectomies. Depending on the type, the surgery will:  Remove the top part of the uterus only.  Remove the uterus and the cervix.  Remove the uterus, cervix, and tissue that holds the uterus in place in the lower belly. WAYS A HYSTERECTOMY CAN BE PERFORMED There are 5 ways this surgery can be performed.   A  cut (incision) is made in the belly (abdomen). The uterus is taken out through the cut.  A cut is made in the vagina. The uterus is taken out through the cut.  Three or four cuts are made in the belly. A surgical device with a camera is put through one of the cuts. The uterus is cut into small pieces. The uterus is taken out through the cuts or the vagina.  Three or four cuts are made in the belly. A surgical device with a camera is put through one of the cuts. The uterus is taken out through the vagina.  Three or four cuts are made in the belly. A surgical device that is controlled by a computer makes a visual image. The device helps the surgeon control the surgical tools. The uterus is cut into small pieces. The pieces are taken out through the cuts or through the vagina. WHAT TO EXPECT AFTER THE SURGERY  You will be given pain medicine.  You will need help at home for 3-5 days after surgery.  You will need to see your doctor in 2-4 weeks after surgery.  You may get hot flashes, have night sweats, and have trouble sleeping.  You may need to have Pap tests in the future if your surgery was related to cancer. Talk to your doctor. It is still good to have regular exams.   This information is not intended to replace advice given to you by your health care provider. Make sure you discuss any questions you have with your health care provider.   Document Released: 06/17/2011 Document Revised: 01/13/2013 Document Reviewed: 11/30/2012 Elsevier Interactive Patient Education [employer].

## 2015-08-22 NOTE — Progress Notes (Signed)
D/C order from Dr. Ouida Sills.  Reviewed d/c instructions and prescriptions with patient and answered any questions.  Patient d/c home via wheelchair by auxillary.

## 2015-08-22 NOTE — Progress Notes (Signed)
Per Dr. Ouida Sills rounding last night, the pt's catheter may be removed at 0500 on 08-21-05; urine output has been wonderful and the color is WNL

## 2015-08-22 NOTE — Discharge Summary (Signed)
Physician Discharge Summary  Patient ID: Cathy Hunt MRN: [id number] DOB/AGE: [birth year] 66 y.o.  Admit date: 08/21/2015 Discharge date: 08/22/2015  Admission Diagnoses:  Discharge Diagnoses:  Active Problems:   Postoperative state   Discharged Condition: good  Hospital Course: s/p uncomplicated TVH and bilat salpingectomy   Consults: None  Significant Diagnostic Studies: labs: hct 37%  Treatments: surgery as above  Discharge Exam: Blood pressure 106/57, pulse 61, temperature 97.9 F (36.6 C), temperature source Oral, resp. rate 20, height 5\' 6"  (1.676 m), weight 135 lb (61.236 kg), SpO2 96 %. General appearance: alert and cooperative Resp: clear to auscultation bilaterally Cardio: regular rate and rhythm, S1, S2 normal, no murmur, click, rub or gallop GI: soft, non-tender; bowel sounds normal; no masses,  no organomegaly Pelvic: no blood   Disposition: 01-Home or Self Care  Discharge Instructions    Call MD for:  difficulty breathing, headache or visual disturbances    Complete by:  As directed      Call MD for:  extreme fatigue    Complete by:  As directed      Call MD for:  hives    Complete by:  As directed      Call MD for:  persistant dizziness or light-headedness    Complete by:  As directed      Call MD for:  persistant nausea and vomiting    Complete by:  As directed      Call MD for:  redness, tenderness, or signs of infection (pain, swelling, redness, odor or green/yellow discharge around incision site)    Complete by:  As directed      Call MD for:  severe uncontrolled pain    Complete by:  As directed      Call MD for:  temperature >100.4    Complete by:  As directed      Diet - low sodium heart healthy    Complete by:  As directed      Increase activity slowly    Complete by:  As directed             Medication List    STOP taking these medications        aspirin 81 MG tablet     meloxicam 7.5 MG tablet  Commonly known as:  MOBIC       TAKE these medications        docusate sodium 100 MG capsule  Commonly known as:  COLACE  Take 1 capsule (100 mg total) by mouth 2 (two) times daily.     EXCEDRIN MIGRAINE 250-250-65 MG tablet  Generic drug:  aspirin-acetaminophen-caffeine  Take 2 tablets by mouth every 6 (six) hours as needed for headache.     HM MULTIVITAMIN ADULT GUMMY Chew  Chew 1 Dose by mouth every morning.     ibuprofen 200 MG tablet  Commonly known as:  ADVIL  Take 2-4 tabs  q 8 hrs prn pain     levothyroxine 175 MCG tablet  Commonly known as:  SYNTHROID, LEVOTHROID  Take 175 mcg by mouth daily before breakfast.     ondansetron 8 MG disintegrating tablet  Commonly known as:  ZOFRAN ODT  Take 1 tablet (8 mg total) by mouth every 8 (eight) hours as needed for nausea or vomiting.     oxyCODONE-acetaminophen 5-325 MG tablet  Commonly known as:  ROXICET  Take 1 tablet by mouth every 4 (four) hours as needed for severe pain.     tiZANidine 2  MG tablet  Commonly known as:  ZANAFLEX  Take 2 mg by mouth at bedtime as needed for muscle spasms.           Follow-up Information    Follow up with SCHERMERHORN,THOMAS, MD In 2 weeks.   Specialty:  Obstetrics and Gynecology   Why:  For wound re-check   Contact information:   [address] [place] [address] [phone]       Signed: Laverta Baltimore 08/22/2015, 9:05 AM

## 2015-08-22 NOTE — Op Note (Signed)
NAME:  Cathy Hunt, Cathy Hunt                    ACCOUNT NO.:  MEDICAL RECORD NO.:  [id number]  LOCATION:                                 FACILITY:  PHYSICIAN:  Laverta Baltimore, MDDATE OF BIRTH:  04-28-1950  DATE OF PROCEDURE: DATE OF DISCHARGE:                              OPERATIVE REPORT   PREOPERATIVE DIAGNOSIS:  Recurrent cervical dysplasia.  POSTOPERATIVE DIAGNOSIS:  Recurrent cervical dysplasia.  PROCEDURE:  Total vaginal hysterectomy, bilateral salpingectomy.  ANESTHESIA:  General endotracheal anesthesia.  SURGEON:  Laverta Baltimore, MD  FIRST ASSISTANT:  Leafy Ro  INDICATIONS:  A 65 year old, gravida 2, para 1 patient with a prior history of a cold knife conization in the 90s for cervical cancer. Patient underwent a recent Pap smear that showed dysplasia.  Colposcopic evaluation showed a positive endocervical curettage with a limited colposcopic evaluation.  DESCRIPTION OF PROCEDURE:  After adequate general endotracheal anesthesia, patient was placed in candy-cane stirrups.  The patient was prepped and draped in normal sterile fashion.  She did receive 2 g IV cefoxitin prior to commencement of the case.  Time-out was performed. The patient's bladder was then catheterized with an in-and-out catheter yielding 50 mL clear urine.  A weighted speculum was placed in the posterior vaginal vault.  The anterior and posterior cervix were grasped with thyroid tenacula.  Cervix was circumferentially injected without difficulty.  A direct posterior colpotomy incision was made.  Upon entry into the posterior cul-de-sac, the uterosacral ligaments were bilaterally clamped, transected, suture ligated with 0 Vicryl suture. Cervix was then circumscribed with the Bovie anteriorly and the anterior cul-de-sac was entered without difficulty.  Cardinal ligaments were then bilaterally clamped, transected, suture ligated with 0 Vicryl suture followed by bilateral clamping of the uterine  arteries, transection, and suture ligated with 0 Vicryl suture.  Sequential clamps were used with ultimate clamping of the uterine cornua.  Uterus was delivered and appeared normal.  The pedicles were doubly ligated with 0 Vicryl suture. Ovaries were extremely high and could not be brought down to be adequately removed.  Therefore, only the fallopian tubes were bilaterally clamped, transected, and suture ligated with 0 Vicryl suture.  Pedicles appeared hemostatic.  Peritoneum was then closed with a pursestring 2-0 PDS suture and the vaginal epithelium was then closed with a running 0 Vicryl suture.  The uterosacral ligaments were plicated centrally and the rest of vaginal vault was closed without difficulty. Good hemostasis was noted.  There were no complications.  ESTIMATED BLOOD LOSS:  50 mL.  INTRAOPERATIVE FLUIDS:  1000 mL.  At the end of the case, Foley was placed yielding another 50 mL clear urine, so total urine output 100 mL.  Patient was taken to recovery room in good condition.          ______________________________ Laverta Baltimore, MD     TS/MEDQ  D:  08/21/2015  T:  08/22/2015  Job:  [id number]

## 2015-08-23 LAB — SURGICAL PATHOLOGY

## 2015-10-19 ENCOUNTER — Other Ambulatory Visit: Payer: Self-pay | Admitting: Internal Medicine

## 2015-10-19 DIAGNOSIS — M25512 Pain in left shoulder: Principal | ICD-10-CM

## 2015-10-19 DIAGNOSIS — G8929 Other chronic pain: Secondary | ICD-10-CM

## 2015-10-19 DIAGNOSIS — M7582 Other shoulder lesions, left shoulder: Secondary | ICD-10-CM | POA: Diagnosis not present

## 2015-11-01 ENCOUNTER — Ambulatory Visit
Admission: RE | Admit: 2015-11-01 | Discharge: 2015-11-01 | Disposition: A | Discharge status: Home / Self Care | Payer: PPO | Source: Ambulatory Visit | Attending: Internal Medicine | Admitting: Internal Medicine

## 2015-11-01 DIAGNOSIS — M75122 Complete rotator cuff tear or rupture of left shoulder, not specified as traumatic: Secondary | ICD-10-CM | POA: Insufficient documentation

## 2015-11-01 DIAGNOSIS — X58XXXA Exposure to other specified factors, initial encounter: Secondary | ICD-10-CM | POA: Diagnosis not present

## 2015-11-01 DIAGNOSIS — M75102 Unspecified rotator cuff tear or rupture of left shoulder, not specified as traumatic: Secondary | ICD-10-CM | POA: Diagnosis not present

## 2015-11-01 DIAGNOSIS — G8929 Other chronic pain: Secondary | ICD-10-CM | POA: Diagnosis not present

## 2015-11-01 DIAGNOSIS — M19012 Primary osteoarthritis, left shoulder: Secondary | ICD-10-CM | POA: Diagnosis not present

## 2015-11-01 DIAGNOSIS — M7552 Bursitis of left shoulder: Secondary | ICD-10-CM | POA: Diagnosis not present

## 2015-11-01 DIAGNOSIS — M25512 Pain in left shoulder: Secondary | ICD-10-CM | POA: Insufficient documentation

## 2015-11-17 DIAGNOSIS — M75122 Complete rotator cuff tear or rupture of left shoulder, not specified as traumatic: Secondary | ICD-10-CM | POA: Diagnosis not present

## 2015-11-17 DIAGNOSIS — M7582 Other shoulder lesions, left shoulder: Secondary | ICD-10-CM | POA: Diagnosis not present

## 2015-11-21 DIAGNOSIS — E039 Hypothyroidism, unspecified: Secondary | ICD-10-CM | POA: Diagnosis not present

## 2015-11-28 DIAGNOSIS — E039 Hypothyroidism, unspecified: Secondary | ICD-10-CM | POA: Diagnosis not present

## 2015-12-13 ENCOUNTER — Ambulatory Visit
Admission: RE | Admit: 2015-12-13 | Discharge: 2015-12-13 | Disposition: A | Discharge status: Home / Self Care | Payer: PPO | Source: Ambulatory Visit | Attending: Surgery | Admitting: Surgery

## 2015-12-13 ENCOUNTER — Encounter
Admission: RE | Disposition: A | Discharge status: Home / Self Care | Payer: Self-pay | Source: Ambulatory Visit | Attending: Surgery

## 2015-12-13 ENCOUNTER — Ambulatory Visit: Payer: PPO | Admitting: Student in an Organized Health Care Education/Training Program

## 2015-12-13 DIAGNOSIS — Z8541 Personal history of malignant neoplasm of cervix uteri: Secondary | ICD-10-CM | POA: Diagnosis not present

## 2015-12-13 DIAGNOSIS — Z86010 Personal history of colonic polyps: Secondary | ICD-10-CM | POA: Diagnosis not present

## 2015-12-13 DIAGNOSIS — R51 Headache: Secondary | ICD-10-CM | POA: Insufficient documentation

## 2015-12-13 DIAGNOSIS — M7582 Other shoulder lesions, left shoulder: Secondary | ICD-10-CM | POA: Diagnosis not present

## 2015-12-13 DIAGNOSIS — M75122 Complete rotator cuff tear or rupture of left shoulder, not specified as traumatic: Secondary | ICD-10-CM | POA: Diagnosis not present

## 2015-12-13 DIAGNOSIS — Z880 Allergy status to penicillin: Secondary | ICD-10-CM | POA: Insufficient documentation

## 2015-12-13 DIAGNOSIS — G43909 Migraine, unspecified, not intractable, without status migrainosus: Secondary | ICD-10-CM | POA: Insufficient documentation

## 2015-12-13 DIAGNOSIS — Z888 Allergy status to other drugs, medicaments and biological substances status: Secondary | ICD-10-CM | POA: Diagnosis not present

## 2015-12-13 DIAGNOSIS — Z91040 Latex allergy status: Secondary | ICD-10-CM | POA: Diagnosis not present

## 2015-12-13 DIAGNOSIS — G4700 Insomnia, unspecified: Secondary | ICD-10-CM | POA: Insufficient documentation

## 2015-12-13 DIAGNOSIS — M659 Synovitis and tenosynovitis, unspecified: Secondary | ICD-10-CM | POA: Diagnosis not present

## 2015-12-13 DIAGNOSIS — M7542 Impingement syndrome of left shoulder: Secondary | ICD-10-CM | POA: Diagnosis not present

## 2015-12-13 DIAGNOSIS — Z7982 Long term (current) use of aspirin: Secondary | ICD-10-CM | POA: Diagnosis not present

## 2015-12-13 DIAGNOSIS — M75102 Unspecified rotator cuff tear or rupture of left shoulder, not specified as traumatic: Secondary | ICD-10-CM | POA: Diagnosis not present

## 2015-12-13 DIAGNOSIS — Z8249 Family history of ischemic heart disease and other diseases of the circulatory system: Secondary | ICD-10-CM | POA: Diagnosis not present

## 2015-12-13 DIAGNOSIS — Z8639 Personal history of other endocrine, nutritional and metabolic disease: Secondary | ICD-10-CM | POA: Diagnosis not present

## 2015-12-13 DIAGNOSIS — Z79899 Other long term (current) drug therapy: Secondary | ICD-10-CM | POA: Diagnosis not present

## 2015-12-13 DIAGNOSIS — E039 Hypothyroidism, unspecified: Secondary | ICD-10-CM | POA: Insufficient documentation

## 2015-12-13 DIAGNOSIS — Z885 Allergy status to narcotic agent status: Secondary | ICD-10-CM | POA: Insufficient documentation

## 2015-12-13 DIAGNOSIS — D509 Iron deficiency anemia, unspecified: Secondary | ICD-10-CM | POA: Insufficient documentation

## 2015-12-13 DIAGNOSIS — M19012 Primary osteoarthritis, left shoulder: Secondary | ICD-10-CM | POA: Diagnosis not present

## 2015-12-13 DIAGNOSIS — M25512 Pain in left shoulder: Secondary | ICD-10-CM | POA: Diagnosis not present

## 2015-12-13 DIAGNOSIS — M65812 Other synovitis and tenosynovitis, left shoulder: Secondary | ICD-10-CM | POA: Diagnosis not present

## 2015-12-13 DIAGNOSIS — Z9079 Acquired absence of other genital organ(s): Secondary | ICD-10-CM | POA: Insufficient documentation

## 2015-12-13 DIAGNOSIS — G8918 Other acute postprocedural pain: Secondary | ICD-10-CM | POA: Diagnosis not present

## 2015-12-13 HISTORY — DX: Dental restoration status: Z98.811

## 2015-12-13 HISTORY — PX: SHOULDER ARTHROSCOPY: SHX128

## 2015-12-13 SURGERY — ARTHROSCOPY, SHOULDER
Anesthesia: General | Site: Shoulder | Laterality: Left | Wound class: Clean

## 2015-12-13 MED ORDER — OXYCODONE HCL 5 MG PO TABS: 5.0000 mg | ORAL_TABLET | ORAL | Status: DC | PRN

## 2015-12-13 MED ORDER — BUPIVACAINE-EPINEPHRINE (PF) 0.5% -1:200000 IJ SOLN
INTRAMUSCULAR | Status: DC | PRN
  Administered 2015-12-13: 20 mL

## 2015-12-13 MED ORDER — SCOPOLAMINE 1 MG/3DAYS TD PT72
1.0000 | MEDICATED_PATCH | TRANSDERMAL | Status: DC
  Administered 2015-12-13: 1.5 mg via TRANSDERMAL

## 2015-12-13 MED ORDER — FENTANYL CITRATE (PF) 100 MCG/2ML IJ SOLN: 50.0000 ug | INTRAMUSCULAR | Status: DC | PRN

## 2015-12-13 MED ORDER — METOCLOPRAMIDE HCL 5 MG/ML IJ SOLN: 5.0000 mg | Freq: Three times a day (TID) | INTRAMUSCULAR | Status: DC | PRN

## 2015-12-13 MED ORDER — OXYCODONE HCL 5 MG PO TABS: 5.0000 mg | ORAL_TABLET | ORAL | 0 refills | Status: DC | PRN

## 2015-12-13 MED ORDER — EPHEDRINE SULFATE 50 MG/ML IJ SOLN
INTRAMUSCULAR | Status: DC | PRN
  Administered 2015-12-13 (×3): 5 mg via INTRAVENOUS

## 2015-12-13 MED ORDER — LIDOCAINE HCL (CARDIAC) 20 MG/ML IV SOLN
INTRAVENOUS | Status: DC | PRN
  Administered 2015-12-13: 40 mg via INTRAVENOUS

## 2015-12-13 MED ORDER — ONDANSETRON HCL 4 MG/2ML IJ SOLN
INTRAMUSCULAR | Status: DC | PRN
  Administered 2015-12-13: 4 mg via INTRAVENOUS

## 2015-12-13 MED ORDER — ONDANSETRON HCL 4 MG/2ML IJ SOLN: 4.0000 mg | Freq: Four times a day (QID) | INTRAMUSCULAR | Status: DC | PRN

## 2015-12-13 MED ORDER — ETODOLAC 400 MG PO TABS: 400.0000 mg | ORAL_TABLET | Freq: Two times a day (BID) | ORAL | 3 refills | Status: DC

## 2015-12-13 MED ORDER — PROPOFOL 500 MG/50ML IV EMUL
INTRAVENOUS | Status: DC | PRN
  Administered 2015-12-13: 100 ug/kg/min via INTRAVENOUS

## 2015-12-13 MED ORDER — METOCLOPRAMIDE HCL 5 MG PO TABS: 5.0000 mg | ORAL_TABLET | Freq: Three times a day (TID) | ORAL | Status: DC | PRN

## 2015-12-13 MED ORDER — SODIUM CHLORIDE 0.9 % IV SOLN
900.0000 mg | Freq: Once | INTRAVENOUS | Status: AC
  Administered 2015-12-13: 900 mg via INTRAVENOUS

## 2015-12-13 MED ORDER — LACTATED RINGERS IV SOLN
INTRAVENOUS | Status: DC
  Administered 2015-12-13: 13:00:00 via INTRAVENOUS

## 2015-12-13 MED ORDER — MIDAZOLAM HCL 2 MG/2ML IJ SOLN
1.0000 mg | INTRAMUSCULAR | Status: DC | PRN
  Administered 2015-12-13: 2 mg via INTRAVENOUS

## 2015-12-13 MED ORDER — ONDANSETRON HCL 4 MG PO TABS: 4.0000 mg | ORAL_TABLET | Freq: Four times a day (QID) | ORAL | Status: DC | PRN

## 2015-12-13 MED ORDER — POTASSIUM CHLORIDE IN NACL 20-0.9 MEQ/L-% IV SOLN: INTRAVENOUS | Status: DC

## 2015-12-13 MED ORDER — DEXAMETHASONE SODIUM PHOSPHATE 4 MG/ML IJ SOLN
INTRAMUSCULAR | Status: DC | PRN
  Administered 2015-12-13: 8 mg via INTRAVENOUS

## 2015-12-13 SURGICAL SUPPLY — 43 items
ANCHOR JUGGERKNOT WTAP NDL 2.9 (Anchor) ×4 IMPLANT
ANCHOR SUT QUATTRO KNTLS 4.5 (Anchor) ×4 IMPLANT
BIT DRILL JUGRKNT W/NDL BIT2.9 (DRILL) ×1 IMPLANT
BLADE FULL RADIUS 3.5 (BLADE) ×2 IMPLANT
BUR ACROMIONIZER 4.0 (BURR) ×2 IMPLANT
CANNULA THRD 8.5X72 DISP (CANNULA) ×2 IMPLANT
CHLORAPREP W/TINT 26ML (MISCELLANEOUS) ×4 IMPLANT
COVER LIGHT HANDLE UNIVERSAL (MISCELLANEOUS) ×4 IMPLANT
COVER MAYO STAND STRL (DRAPES) ×2 IMPLANT
DRAPE IMP U-DRAPE 54X76 (DRAPES) ×2 IMPLANT
DRILL JUGGERKNOT W/NDL BIT 2.9 (DRILL) ×2
GAUZE PETRO XEROFOAM 1X8 (MISCELLANEOUS) ×2 IMPLANT
GAUZE SPONGE 4X4 12PLY STRL (GAUZE/BANDAGES/DRESSINGS) ×2 IMPLANT
GLOVE BIOGEL PI IND STRL 7.0 (GLOVE) ×2 IMPLANT
GLOVE BIOGEL PI IND STRL 8 (GLOVE) ×1 IMPLANT
GLOVE BIOGEL PI INDICATOR 7.0 (GLOVE) ×2
GLOVE BIOGEL PI INDICATOR 8 (GLOVE) ×1
GLOVE SKINSENSE NS SZ6.5 (GLOVE) ×1
GLOVE SKINSENSE NS SZ8.0 LF (GLOVE) ×1
GLOVE SKINSENSE STRL SZ6.5 (GLOVE) ×1 IMPLANT
GLOVE SKINSENSE STRL SZ8.0 LF (GLOVE) ×1 IMPLANT
GOWN STRL REUS W/ TWL LRG LVL3 (GOWN DISPOSABLE) ×1 IMPLANT
GOWN STRL REUS W/ TWL XL LVL3 (GOWN DISPOSABLE) ×1 IMPLANT
GOWN STRL REUS W/TWL LRG LVL3 (GOWN DISPOSABLE) ×1
GOWN STRL REUS W/TWL XL LVL3 (GOWN DISPOSABLE) ×1
IV LACTATED RINGER IRRG 3000ML (IV SOLUTION) ×1
IV LR IRRIG 3000ML ARTHROMATIC (IV SOLUTION) ×1 IMPLANT
MANIFOLD 4PT FOR NEPTUNE1 (MISCELLANEOUS) ×2 IMPLANT
MAT BLUE FLOOR 46X72 FLO (MISCELLANEOUS) ×2 IMPLANT
NEEDLE HYPO 21X1.5 SAFETY (NEEDLE) ×2 IMPLANT
NEEDLE REVERSE CUT 1/2 CRC (NEEDLE) IMPLANT
PACK ARTHROSCOPY SHOULDER (MISCELLANEOUS) ×2 IMPLANT
PAD GROUND ADULT SPLIT (MISCELLANEOUS) IMPLANT
SLING ULTRA II M (MISCELLANEOUS) ×2 IMPLANT
STAPLER SKIN PROX 35W (STAPLE) ×2 IMPLANT
STRAP BODY AND KNEE 60X3 (MISCELLANEOUS) ×4 IMPLANT
SUT ETHIBOND 0 MO6 C/R (SUTURE) ×2 IMPLANT
SUT VIC AB 2-0 CT1 27 (SUTURE) ×2
SUT VIC AB 2-0 CT1 TAPERPNT 27 (SUTURE) ×2 IMPLANT
TAPE MICROFOAM 4IN (TAPE) ×2 IMPLANT
TUBING ARTHRO INFLOW-ONLY STRL (TUBING) ×2 IMPLANT
TUBING CONNECTING 10 (TUBING) ×2 IMPLANT
WAND HAND CNTRL MULTIVAC 90 (MISCELLANEOUS) ×2 IMPLANT

## 2015-12-13 NOTE — Progress Notes (Signed)
Assisted Scott Mculloch ANMD with left, ultrasound guided, interscalene  block. Side rails up, monitors on throughout procedure. See vital signs in flow sheet. Tolerated Procedure well.

## 2015-12-13 NOTE — Anesthesia Procedure Notes (Signed)
Procedure Name: MAC Performed by: Londell Moh Pre-anesthesia Checklist: Patient identified, Patient being monitored, Emergency Drugs available, Timeout performed and Suction available Patient Re-evaluated:Patient Re-evaluated prior to inductionOxygen Delivery Method: Simple face mask Preoxygenation: Pre-oxygenation with 100% oxygen Intubation Type: IV induction Placement Confirmation: positive ETCO2 Dental Injury: Teeth and Oropharynx as per pre-operative assessment

## 2015-12-13 NOTE — Anesthesia Postprocedure Evaluation (Signed)
Anesthesia Post Note  Patient: Cathy Hunt  Procedure(s) Performed: Procedure(s) (LRB): arthroscopic debridement, arthroscopic subacromial decompression, and mini-open rotator cuff repair, left shoulder (Left)  Patient location during evaluation: PACU Anesthesia Type: MAC Level of consciousness: awake and alert Pain management: pain level controlled Vital Signs Assessment: post-procedure vital signs reviewed and stable Respiratory status: spontaneous breathing, nonlabored ventilation, respiratory function stable and patient connected to nasal cannula oxygen Cardiovascular status: stable and blood pressure returned to baseline Anesthetic complications: no    Marshell Levan

## 2015-12-13 NOTE — Transfer of Care (Signed)
Immediate Anesthesia Transfer of Care Note  Patient: Cathy Hunt  Procedure(s) Performed: Procedure(s) with comments: ARTHROSCOPY SHOULDER WITH DEBRIDEMENT DECOMPRESSION AND REPAIR OF A ROTATOR CUFF TEAR (Left) - Patien requests to be first  Patient Location: PACU  Anesthesia Type: General  Level of Consciousness: awake, alert  and patient cooperative  Airway and Oxygen Therapy: Patient Spontanous Breathing and Patient connected to supplemental oxygen  Post-op Assessment: Post-op Vital signs reviewed, Patient's Cardiovascular Status Stable, Respiratory Function Stable, Patent Airway and No signs of Nausea or vomiting  Post-op Vital Signs: Reviewed and stable  Complications: No apparent anesthesia complications

## 2015-12-13 NOTE — Anesthesia Preprocedure Evaluation (Addendum)
Anesthesia Evaluation  Patient identified by MRN, date of birth, ID band Patient awake    History of Anesthesia Complications (+) PONV and history of anesthetic complications  Airway Mallampati: I  TM Distance: >3 FB Neck ROM: Full    Dental   Pulmonary    Pulmonary exam normal        Cardiovascular Normal cardiovascular exam     Neuro/Psych  Headaches,    GI/Hepatic   Endo/Other  Hypothyroidism   Renal/GU      Musculoskeletal   Abdominal   Peds  Hematology   Anesthesia Other Findings   Reproductive/Obstetrics                            Anesthesia Physical Anesthesia Plan  ASA: II  Anesthesia Plan: General   Post-op Pain Management:  Regional for Post-op pain   Induction: Intravenous  Airway Management Planned: LMA  Additional Equipment:   Intra-op Plan:   Post-operative Plan:   Informed Consent: I have reviewed the patients History and Physical, chart, labs and discussed the procedure including the risks, benefits and alternatives for the proposed anesthesia with the patient or authorized representative who has indicated his/her understanding and acceptance.     Plan Discussed with: CRNA  Anesthesia Plan Comments:         Anesthesia Quick Evaluation                                   Anesthesia Evaluation  Patient identified by MRN, date of birth, ID band Patient awake    Reviewed: Allergy & Precautions, H&P , NPO status , Patient's Chart, lab work & pertinent test results, reviewed documented beta blocker date and time   History of Anesthesia Complications (+) PONV and history of anesthetic complications  Airway Mallampati: II  TM Distance: >3 FB Neck ROM: full    Dental no notable dental hx. (+) Teeth Intact   Pulmonary neg pulmonary ROS,    Pulmonary exam normal breath sounds clear to auscultation       Cardiovascular Exercise Tolerance:  Good negative cardio ROS Normal cardiovascular exam Rhythm:regular Rate:Normal     Neuro/Psych negative neurological ROS  negative psych ROS   GI/Hepatic negative GI ROS, Neg liver ROS,   Endo/Other  neg diabetesHypothyroidism   Renal/GU negative Renal ROS  negative genitourinary   Musculoskeletal   Abdominal   Peds  Hematology negative hematology ROS (+)   Anesthesia Other Findings Past Medical History:   Hypothyroidism                                               Headache                                                     Arthritis                                                    Cancer ([place])  Comment:Cervical   Insomnia                                                     Complication of anesthesia                                     Comment:difficulty following verbal commands after vein              ligation surgery   PONV (postoperative nausea and vomiting)                     Reproductive/Obstetrics negative OB ROS                             Anesthesia Physical Anesthesia Plan  ASA: II  Anesthesia Plan: General   Post-op Pain Management:    Induction:   Airway Management Planned:   Additional Equipment:   Intra-op Plan:   Post-operative Plan:   Informed Consent: I have reviewed the patients History and Physical, chart, labs and discussed the procedure including the risks, benefits and alternatives for the proposed anesthesia with the patient or authorized representative who has indicated his/her understanding and acceptance.   Dental Advisory Given  Plan Discussed with: Anesthesiologist, CRNA and Surgeon  Anesthesia Plan Comments:         Anesthesia Quick Evaluation

## 2015-12-13 NOTE — Discharge Instructions (Signed)
General Anesthesia, Adult, Care After Refer to this sheet in the next few weeks. These instructions provide you with information on caring for yourself after your procedure. Your health care provider may also give you more specific instructions. Your treatment has been planned according to current medical practices, but problems sometimes occur. Call your health care provider if you have any problems or questions after your procedure. WHAT TO EXPECT AFTER THE PROCEDURE After the procedure, it is typical to experience:  Sleepiness.  Nausea and vomiting. HOME CARE INSTRUCTIONS  For the first 24 hours after general anesthesia:  Have a responsible person with you.  Do not drive a car. If you are alone, do not take public transportation.  Do not drink alcohol.  Do not take medicine that has not been prescribed by your health care provider.  Do not sign important papers or make important decisions.  You may resume a normal diet and activities as directed by your health care provider.  Change bandages (dressings) as directed.  If you have questions or problems that seem related to general anesthesia, call the hospital and ask for the anesthetist or anesthesiologist on call. SEEK MEDICAL CARE IF:  You have nausea and vomiting that continue the day after anesthesia.  You develop a rash. SEEK IMMEDIATE MEDICAL CARE IF:   You have difficulty breathing.  You have chest pain.  You have any allergic problems.   This information is not intended to replace advice given to you by your health care provider. Make sure you discuss any questions you have with your health care provider.   Document Released: 07/01/2000 Document Revised: 04/15/2014 Document Reviewed: 07/24/2011 Elsevier Interactive Patient Education 2016 [employer].  Keep dressing dry and intact.  May shower after dressing changed on post-op day #4 (Sunday).  Cover staples with Band-Aids after drying off. Apply ice  frequently to shoulder. Take etodolac 400 mg BID with meals. Take oxycodone as prescribed when needed.  May supplement with ES Tylenol if necessary. Keep shoulder immobilizer on at all times except may remove for bathing purposes. Follow-up in 10-14 days or as scheduled.

## 2015-12-13 NOTE — Op Note (Signed)
12/13/2015  4:38 PM  Patient:   Cathy Hunt  Pre-Op Diagnosis:   Impingement/tendinopathy with rotator cuff tear, left shoulder.  Postoperative diagnosis: Impingement/tendinopathy with rotator cuff tear, extensive synovitis, and early degenerative joint disease, left shoulder.  Procedure: Extensive arthroscopic debridement, arthroscopic subacromial decompression, and mini-open rotator cuff repair, left shoulder.  Anesthesia: IV sedation with interscalene block placed preoperatively by the anesthesiologist.  Surgeon:   Pascal Lux, MD  Assistant:   Donnie Coffin, PA-S  Findings: As above. There was a full-thickness tear of the supraspinatus tendon measuring approximately 1.5 x 2 cm. The remainder the rotator cuff was in satisfactory condition. There were areas of focal grade 3-4 chondromalacia involving the central portion of the humeral head, as well as grade 1 chondromalacia involving the glenoid articular surface. The biceps tendon and labrum both were in excellent condition.  Complications: None  Fluids:   1000 cc  Estimated blood loss: 12 cc  Tourniquet time: None  Drains: None  Closure: Staples   Brief clinical note: The patient is a 65 year old female with a history of left shoulder pain. The patient's symptoms have progressed despite medications, activity modification, etc. The patient's history and examination are consistent with impingement/tendinopathy with a rotator cuff tear. These findings were confirmed by MRI scan. The patient presents at this time for definitive management of these shoulder symptoms.  Procedure: The patient underwent placement of an interscalene block by the anesthesiologist in the preoperative holding area before she was brought into the operating room and lain in the supine position. After adequate IV sedation was achieved, the patient was repositioned in the beach chair position using the beach chair positioner. The  left shoulder and upper extremity were prepped with ChloraPrep solution before being draped sterilely. Preoperative antibiotics were administered. A timeout was performed to confirm the proper surgical site before the expected portal sites and incision site were injected with 0.5% Sensorcaine with epinephrine. A posterior portal was created and the glenohumeral joint thoroughly inspected with the findings as described above. An anterior portal was created using an outside-in technique. The labrum and rotator cuff were further probed, again confirming the above-noted findings. The areas of extensive synovitis were debrided back to stable margins using the full-radius resector, as were areas of focal chondromalacia involving the humeral head and glenoid. The margins of the rotator cuff tear also were debrided back to stable margins using the full-radius resector. The ArthroCare wand was inserted and used to obtain hemostasis. The instruments were removed from the joint after suctioning the excess fluid.  The camera was repositioned through the posterior portal into the subacromial space. A separate lateral portal was created using an outside-in technique. The 3.5 mm full-radius resector was introduced and used to perform a subtotal bursectomy. The ArthroCare wand was then inserted and used to remove the periosteal tissue off the undersurface of the anterior third of the acromion as well as to recess the coracoacromial ligament from its attachment along the anterior and lateral margins of the acromion. The 4.0 mm acromionizing bur was introduced and used to complete the decompression by removing the undersurface of the anterior third of the acromion. The full radius resector was reintroduced to remove any residual bony debris before the ArthroCare wand was reintroduced to obtain hemostasis. The instruments were then removed from the subacromial space after suctioning the excess fluid.  An approximately 4-5 cm  incision was made over the anterolateral aspect of the shoulder beginning at the anterolateral corner of the acromion and  extending distally in line with the bicipital groove. This incision was carried down through the subcutaneous tissues to expose the deltoid fascia. The raphae between the anterior and middle thirds was identified and this plane developed to provide access into the subacromial space. Additional bursal tissues were debrided sharply using Metzenbaum scissors. The rotator cuff tear was readily identified. The margins were debrided sharply with a #15 blade and the exposed greater tuberosity roughened with a rongeur. The tear was repaired using two Biomet 2.9 mm JuggerKnot anchors. Several of these sutures were then brought back laterally and secured using two [place] QuatroLink anchors to create a two-layer closure. An apparent watertight closure was obtained.  The wound was copiously irrigated with sterile saline solution before the deltoid raphae was reapproximated using 2-0 Vicryl interrupted sutures. The subcutaneous tissues were closed in two layers using 2-0 Vicryl interrupted sutures before the skin was closed using staples. The portal sites also were closed using staples. A sterile bulky dressing was applied to the shoulder before the arm was placed into a shoulder immobilizer. The patient was then awakened, extubated, and returned to the recovery room in satisfactory condition after tolerating the procedure well.

## 2015-12-13 NOTE — Anesthesia Procedure Notes (Signed)
Anesthesia Regional Block:  Interscalene brachial plexus block  Pre-Anesthetic Checklist: ,, timeout performed, Correct Patient, Correct Site, Correct Laterality, Correct Procedure, Correct Position, site marked, Risks and benefits discussed,  Surgical consent,  Pre-op evaluation,  At surgeon's request and post-op pain management  Laterality: Left  Prep: chloraprep       Needles:  Injection technique: Single-shot  Needle Type: Stimiplex     Needle Length: 10cm 10 cm Needle Gauge: 21 and 21 G    Additional Needles:  Procedures: ultrasound guided (picture in chart) Interscalene brachial plexus block Narrative:  Injection made incrementally with aspirations every 5 mL.  Performed by: Personally  Anesthesiologist: Waleska Buttery  Additional Notes: Functioning IV was confirmed and monitors applied. Ultrasound guidance: relevant anatomy identified, needle position confirmed, local anesthetic spread visualized around nerve(s)., vascular puncture avoided.  Image printed for medical record.  Negative aspiration and no paresthesias; incremental administration of local anesthetic. The patient tolerated the procedure well. Vitals signes recorded in RN notes.

## 2015-12-13 NOTE — H&P (Signed)
Paper H&P to be scanned into permanent record. H&P reviewed. No changes. 

## 2015-12-14 ENCOUNTER — Encounter: Payer: Self-pay | Admitting: Surgery

## 2015-12-14 DIAGNOSIS — M25512 Pain in left shoulder: Secondary | ICD-10-CM | POA: Diagnosis not present

## 2015-12-14 DIAGNOSIS — G8918 Other acute postprocedural pain: Secondary | ICD-10-CM | POA: Diagnosis not present

## 2015-12-14 DIAGNOSIS — M75122 Complete rotator cuff tear or rupture of left shoulder, not specified as traumatic: Secondary | ICD-10-CM | POA: Diagnosis not present

## 2015-12-18 DIAGNOSIS — Z9889 Other specified postprocedural states: Secondary | ICD-10-CM | POA: Diagnosis not present

## 2015-12-26 DIAGNOSIS — Z9889 Other specified postprocedural states: Secondary | ICD-10-CM | POA: Diagnosis not present

## 2015-12-28 DIAGNOSIS — Z9889 Other specified postprocedural states: Secondary | ICD-10-CM | POA: Diagnosis not present

## 2016-01-02 DIAGNOSIS — Z9889 Other specified postprocedural states: Secondary | ICD-10-CM | POA: Diagnosis not present

## 2016-01-04 DIAGNOSIS — Z9889 Other specified postprocedural states: Secondary | ICD-10-CM | POA: Diagnosis not present

## 2016-01-05 DIAGNOSIS — Z23 Encounter for immunization: Secondary | ICD-10-CM | POA: Diagnosis not present

## 2016-01-09 DIAGNOSIS — Z9889 Other specified postprocedural states: Secondary | ICD-10-CM | POA: Diagnosis not present

## 2016-01-11 DIAGNOSIS — Z9889 Other specified postprocedural states: Secondary | ICD-10-CM | POA: Diagnosis not present

## 2016-01-16 DIAGNOSIS — Z1159 Encounter for screening for other viral diseases: Secondary | ICD-10-CM | POA: Diagnosis not present

## 2016-01-16 DIAGNOSIS — Z9889 Other specified postprocedural states: Secondary | ICD-10-CM | POA: Diagnosis not present

## 2016-01-16 DIAGNOSIS — M810 Age-related osteoporosis without current pathological fracture: Secondary | ICD-10-CM | POA: Diagnosis not present

## 2016-01-16 DIAGNOSIS — Z131 Encounter for screening for diabetes mellitus: Secondary | ICD-10-CM | POA: Diagnosis not present

## 2016-01-16 DIAGNOSIS — M25512 Pain in left shoulder: Secondary | ICD-10-CM | POA: Diagnosis not present

## 2016-01-16 DIAGNOSIS — G8929 Other chronic pain: Secondary | ICD-10-CM | POA: Diagnosis not present

## 2016-01-18 DIAGNOSIS — Z9889 Other specified postprocedural states: Secondary | ICD-10-CM | POA: Diagnosis not present

## 2016-01-22 DIAGNOSIS — E039 Hypothyroidism, unspecified: Secondary | ICD-10-CM | POA: Diagnosis not present

## 2016-01-22 DIAGNOSIS — Z131 Encounter for screening for diabetes mellitus: Secondary | ICD-10-CM | POA: Diagnosis not present

## 2016-01-22 DIAGNOSIS — Z1159 Encounter for screening for other viral diseases: Secondary | ICD-10-CM | POA: Diagnosis not present

## 2016-01-22 DIAGNOSIS — M810 Age-related osteoporosis without current pathological fracture: Secondary | ICD-10-CM | POA: Diagnosis not present

## 2016-01-23 DIAGNOSIS — Z9889 Other specified postprocedural states: Secondary | ICD-10-CM | POA: Diagnosis not present

## 2016-01-25 DIAGNOSIS — Z9889 Other specified postprocedural states: Secondary | ICD-10-CM | POA: Diagnosis not present

## 2016-01-30 DIAGNOSIS — Z9889 Other specified postprocedural states: Secondary | ICD-10-CM | POA: Diagnosis not present

## 2016-01-30 DIAGNOSIS — Z23 Encounter for immunization: Secondary | ICD-10-CM | POA: Diagnosis not present

## 2016-01-30 DIAGNOSIS — Z1231 Encounter for screening mammogram for malignant neoplasm of breast: Secondary | ICD-10-CM | POA: Diagnosis not present

## 2016-01-30 DIAGNOSIS — M810 Age-related osteoporosis without current pathological fracture: Secondary | ICD-10-CM | POA: Diagnosis not present

## 2016-01-30 DIAGNOSIS — E039 Hypothyroidism, unspecified: Secondary | ICD-10-CM | POA: Diagnosis not present

## 2016-01-30 DIAGNOSIS — R8290 Unspecified abnormal findings in urine: Secondary | ICD-10-CM | POA: Diagnosis not present

## 2016-01-30 DIAGNOSIS — Z0000 Encounter for general adult medical examination without abnormal findings: Secondary | ICD-10-CM | POA: Diagnosis not present

## 2016-02-01 DIAGNOSIS — Z9889 Other specified postprocedural states: Secondary | ICD-10-CM | POA: Diagnosis not present

## 2016-02-06 DIAGNOSIS — Z9889 Other specified postprocedural states: Secondary | ICD-10-CM | POA: Diagnosis not present

## 2016-02-08 DIAGNOSIS — Z9889 Other specified postprocedural states: Secondary | ICD-10-CM | POA: Diagnosis not present

## 2016-02-13 DIAGNOSIS — Z9889 Other specified postprocedural states: Secondary | ICD-10-CM | POA: Diagnosis not present

## 2016-02-15 DIAGNOSIS — Z9889 Other specified postprocedural states: Secondary | ICD-10-CM | POA: Diagnosis not present

## 2016-02-20 DIAGNOSIS — Z9889 Other specified postprocedural states: Secondary | ICD-10-CM | POA: Diagnosis not present

## 2016-02-22 DIAGNOSIS — Z9889 Other specified postprocedural states: Secondary | ICD-10-CM | POA: Diagnosis not present

## 2016-02-27 DIAGNOSIS — M25612 Stiffness of left shoulder, not elsewhere classified: Secondary | ICD-10-CM | POA: Diagnosis not present

## 2016-02-27 DIAGNOSIS — M6281 Muscle weakness (generalized): Secondary | ICD-10-CM | POA: Diagnosis not present

## 2016-02-27 DIAGNOSIS — M25512 Pain in left shoulder: Secondary | ICD-10-CM | POA: Diagnosis not present

## 2016-02-27 DIAGNOSIS — Z9889 Other specified postprocedural states: Secondary | ICD-10-CM | POA: Diagnosis not present

## 2016-03-05 DIAGNOSIS — Z9889 Other specified postprocedural states: Secondary | ICD-10-CM | POA: Diagnosis not present

## 2016-03-07 DIAGNOSIS — Z9889 Other specified postprocedural states: Secondary | ICD-10-CM | POA: Diagnosis not present

## 2016-03-12 DIAGNOSIS — M6281 Muscle weakness (generalized): Secondary | ICD-10-CM | POA: Diagnosis not present

## 2016-03-12 DIAGNOSIS — M25512 Pain in left shoulder: Secondary | ICD-10-CM | POA: Diagnosis not present

## 2016-03-12 DIAGNOSIS — M25612 Stiffness of left shoulder, not elsewhere classified: Secondary | ICD-10-CM | POA: Diagnosis not present

## 2016-03-12 DIAGNOSIS — Z9889 Other specified postprocedural states: Secondary | ICD-10-CM | POA: Diagnosis not present

## 2016-03-14 DIAGNOSIS — M6281 Muscle weakness (generalized): Secondary | ICD-10-CM | POA: Diagnosis not present

## 2016-03-14 DIAGNOSIS — M25612 Stiffness of left shoulder, not elsewhere classified: Secondary | ICD-10-CM | POA: Diagnosis not present

## 2016-03-14 DIAGNOSIS — Z9889 Other specified postprocedural states: Secondary | ICD-10-CM | POA: Diagnosis not present

## 2016-03-14 DIAGNOSIS — M25512 Pain in left shoulder: Secondary | ICD-10-CM | POA: Diagnosis not present

## 2016-03-15 DIAGNOSIS — M7582 Other shoulder lesions, left shoulder: Secondary | ICD-10-CM | POA: Diagnosis not present

## 2016-03-15 DIAGNOSIS — M75122 Complete rotator cuff tear or rupture of left shoulder, not specified as traumatic: Secondary | ICD-10-CM | POA: Diagnosis not present

## 2016-03-19 DIAGNOSIS — Z9889 Other specified postprocedural states: Secondary | ICD-10-CM | POA: Diagnosis not present

## 2016-03-21 DIAGNOSIS — M6281 Muscle weakness (generalized): Secondary | ICD-10-CM | POA: Diagnosis not present

## 2016-03-21 DIAGNOSIS — M25612 Stiffness of left shoulder, not elsewhere classified: Secondary | ICD-10-CM | POA: Diagnosis not present

## 2016-03-21 DIAGNOSIS — M25512 Pain in left shoulder: Secondary | ICD-10-CM | POA: Diagnosis not present

## 2016-03-21 DIAGNOSIS — Z9889 Other specified postprocedural states: Secondary | ICD-10-CM | POA: Diagnosis not present

## 2016-03-26 DIAGNOSIS — Z9889 Other specified postprocedural states: Secondary | ICD-10-CM | POA: Diagnosis not present

## 2016-03-28 DIAGNOSIS — M898X1 Other specified disorders of bone, shoulder: Secondary | ICD-10-CM | POA: Diagnosis not present

## 2016-03-28 DIAGNOSIS — Z9889 Other specified postprocedural states: Secondary | ICD-10-CM | POA: Diagnosis not present

## 2016-03-29 DIAGNOSIS — M25511 Pain in right shoulder: Secondary | ICD-10-CM | POA: Diagnosis not present

## 2016-04-02 DIAGNOSIS — Z9889 Other specified postprocedural states: Secondary | ICD-10-CM | POA: Diagnosis not present

## 2016-04-04 DIAGNOSIS — Z9889 Other specified postprocedural states: Secondary | ICD-10-CM | POA: Diagnosis not present

## 2016-04-04 DIAGNOSIS — M25512 Pain in left shoulder: Secondary | ICD-10-CM | POA: Diagnosis not present

## 2016-04-04 DIAGNOSIS — M6281 Muscle weakness (generalized): Secondary | ICD-10-CM | POA: Diagnosis not present

## 2016-04-04 DIAGNOSIS — M25612 Stiffness of left shoulder, not elsewhere classified: Secondary | ICD-10-CM | POA: Diagnosis not present

## 2016-04-09 DIAGNOSIS — Z9889 Other specified postprocedural states: Secondary | ICD-10-CM | POA: Diagnosis not present

## 2016-04-16 DIAGNOSIS — Z9889 Other specified postprocedural states: Secondary | ICD-10-CM | POA: Diagnosis not present

## 2016-04-23 DIAGNOSIS — M25512 Pain in left shoulder: Secondary | ICD-10-CM | POA: Diagnosis not present

## 2016-04-23 DIAGNOSIS — M25612 Stiffness of left shoulder, not elsewhere classified: Secondary | ICD-10-CM | POA: Diagnosis not present

## 2016-04-23 DIAGNOSIS — M6281 Muscle weakness (generalized): Secondary | ICD-10-CM | POA: Diagnosis not present

## 2016-04-23 DIAGNOSIS — Z9889 Other specified postprocedural states: Secondary | ICD-10-CM | POA: Diagnosis not present

## 2016-04-30 DIAGNOSIS — E039 Hypothyroidism, unspecified: Secondary | ICD-10-CM | POA: Diagnosis not present

## 2016-04-30 DIAGNOSIS — Z9889 Other specified postprocedural states: Secondary | ICD-10-CM | POA: Diagnosis not present

## 2016-04-30 DIAGNOSIS — M25512 Pain in left shoulder: Secondary | ICD-10-CM | POA: Diagnosis not present

## 2016-05-07 DIAGNOSIS — E039 Hypothyroidism, unspecified: Secondary | ICD-10-CM | POA: Diagnosis not present

## 2016-05-07 DIAGNOSIS — Z9889 Other specified postprocedural states: Secondary | ICD-10-CM | POA: Diagnosis not present

## 2016-05-07 DIAGNOSIS — M6281 Muscle weakness (generalized): Secondary | ICD-10-CM | POA: Diagnosis not present

## 2016-05-07 DIAGNOSIS — M25612 Stiffness of left shoulder, not elsewhere classified: Secondary | ICD-10-CM | POA: Diagnosis not present

## 2016-05-07 DIAGNOSIS — M25512 Pain in left shoulder: Secondary | ICD-10-CM | POA: Diagnosis not present

## 2016-05-15 ENCOUNTER — Other Ambulatory Visit: Payer: Self-pay | Admitting: Internal Medicine

## 2016-05-15 DIAGNOSIS — Z1231 Encounter for screening mammogram for malignant neoplasm of breast: Secondary | ICD-10-CM

## 2016-05-17 DIAGNOSIS — M7582 Other shoulder lesions, left shoulder: Secondary | ICD-10-CM | POA: Diagnosis not present

## 2016-05-17 DIAGNOSIS — M75122 Complete rotator cuff tear or rupture of left shoulder, not specified as traumatic: Secondary | ICD-10-CM | POA: Diagnosis not present

## 2016-06-06 ENCOUNTER — Ambulatory Visit: Payer: PPO

## 2016-06-19 ENCOUNTER — Ambulatory Visit
Admission: RE | Admit: 2016-06-19 | Discharge: 2016-06-19 | Disposition: A | Discharge status: Home / Self Care | Payer: PPO | Source: Ambulatory Visit | Attending: Internal Medicine | Admitting: Internal Medicine

## 2016-06-19 DIAGNOSIS — Z1231 Encounter for screening mammogram for malignant neoplasm of breast: Secondary | ICD-10-CM

## 2016-08-01 DIAGNOSIS — R0789 Other chest pain: Secondary | ICD-10-CM | POA: Diagnosis not present

## 2016-08-01 DIAGNOSIS — M810 Age-related osteoporosis without current pathological fracture: Secondary | ICD-10-CM | POA: Diagnosis not present

## 2016-08-01 DIAGNOSIS — G8929 Other chronic pain: Secondary | ICD-10-CM | POA: Diagnosis not present

## 2016-08-01 DIAGNOSIS — M25512 Pain in left shoulder: Secondary | ICD-10-CM | POA: Diagnosis not present

## 2016-08-01 DIAGNOSIS — Z131 Encounter for screening for diabetes mellitus: Secondary | ICD-10-CM | POA: Diagnosis not present

## 2016-08-01 DIAGNOSIS — E039 Hypothyroidism, unspecified: Secondary | ICD-10-CM | POA: Diagnosis not present

## 2016-08-01 DIAGNOSIS — Z0000 Encounter for general adult medical examination without abnormal findings: Secondary | ICD-10-CM | POA: Diagnosis not present

## 2016-08-07 DIAGNOSIS — E039 Hypothyroidism, unspecified: Secondary | ICD-10-CM | POA: Diagnosis not present

## 2016-09-24 DIAGNOSIS — R87811 Vaginal high risk human papillomavirus (HPV) DNA test positive: Secondary | ICD-10-CM | POA: Diagnosis not present

## 2016-09-24 DIAGNOSIS — Z1211 Encounter for screening for malignant neoplasm of colon: Secondary | ICD-10-CM | POA: Diagnosis not present

## 2016-09-24 DIAGNOSIS — Z01419 Encounter for gynecological examination (general) (routine) without abnormal findings: Secondary | ICD-10-CM | POA: Diagnosis not present

## 2016-09-24 DIAGNOSIS — Z124 Encounter for screening for malignant neoplasm of cervix: Secondary | ICD-10-CM | POA: Diagnosis not present

## 2016-09-24 DIAGNOSIS — R87620 Atypical squamous cells of undetermined significance on cytologic smear of vagina (ASC-US): Secondary | ICD-10-CM | POA: Diagnosis not present

## 2016-09-24 DIAGNOSIS — R87610 Atypical squamous cells of undetermined significance on cytologic smear of cervix (ASC-US): Secondary | ICD-10-CM | POA: Diagnosis not present

## 2016-11-06 DIAGNOSIS — E039 Hypothyroidism, unspecified: Secondary | ICD-10-CM | POA: Diagnosis not present

## 2017-01-29 DIAGNOSIS — M810 Age-related osteoporosis without current pathological fracture: Secondary | ICD-10-CM | POA: Diagnosis not present

## 2017-01-29 DIAGNOSIS — Z131 Encounter for screening for diabetes mellitus: Secondary | ICD-10-CM | POA: Diagnosis not present

## 2017-02-05 DIAGNOSIS — R3129 Other microscopic hematuria: Secondary | ICD-10-CM | POA: Diagnosis not present

## 2017-02-05 DIAGNOSIS — Z0000 Encounter for general adult medical examination without abnormal findings: Secondary | ICD-10-CM | POA: Diagnosis not present

## 2017-02-05 DIAGNOSIS — M810 Age-related osteoporosis without current pathological fracture: Secondary | ICD-10-CM | POA: Diagnosis not present

## 2017-02-05 DIAGNOSIS — Z23 Encounter for immunization: Secondary | ICD-10-CM | POA: Diagnosis not present

## 2017-02-19 DIAGNOSIS — M810 Age-related osteoporosis without current pathological fracture: Secondary | ICD-10-CM | POA: Diagnosis not present

## 2017-05-14 DIAGNOSIS — E039 Hypothyroidism, unspecified: Secondary | ICD-10-CM | POA: Diagnosis not present

## 2017-05-21 DIAGNOSIS — E039 Hypothyroidism, unspecified: Secondary | ICD-10-CM | POA: Diagnosis not present

## 2017-07-09 DIAGNOSIS — B9689 Other specified bacterial agents as the cause of diseases classified elsewhere: Secondary | ICD-10-CM | POA: Diagnosis not present

## 2017-07-09 DIAGNOSIS — J209 Acute bronchitis, unspecified: Secondary | ICD-10-CM | POA: Diagnosis not present

## 2017-07-09 DIAGNOSIS — J019 Acute sinusitis, unspecified: Secondary | ICD-10-CM | POA: Diagnosis not present

## 2017-08-13 ENCOUNTER — Other Ambulatory Visit: Payer: Self-pay | Admitting: Internal Medicine

## 2017-08-13 DIAGNOSIS — Z1231 Encounter for screening mammogram for malignant neoplasm of breast: Secondary | ICD-10-CM

## 2017-08-20 DIAGNOSIS — E039 Hypothyroidism, unspecified: Secondary | ICD-10-CM | POA: Diagnosis not present

## 2017-08-27 DIAGNOSIS — E039 Hypothyroidism, unspecified: Secondary | ICD-10-CM | POA: Diagnosis not present

## 2017-09-17 DIAGNOSIS — M7581 Other shoulder lesions, right shoulder: Secondary | ICD-10-CM | POA: Diagnosis not present

## 2017-09-30 ENCOUNTER — Ambulatory Visit
Admission: RE | Admit: 2017-09-30 | Discharge: 2017-09-30 | Disposition: A | Discharge status: Home / Self Care | Payer: PPO | Source: Ambulatory Visit | Attending: Internal Medicine | Admitting: Internal Medicine

## 2017-09-30 DIAGNOSIS — Z124 Encounter for screening for malignant neoplasm of cervix: Secondary | ICD-10-CM | POA: Diagnosis not present

## 2017-09-30 DIAGNOSIS — Z1231 Encounter for screening mammogram for malignant neoplasm of breast: Secondary | ICD-10-CM | POA: Insufficient documentation

## 2017-09-30 DIAGNOSIS — N952 Postmenopausal atrophic vaginitis: Secondary | ICD-10-CM | POA: Diagnosis not present

## 2017-09-30 DIAGNOSIS — R87612 Low grade squamous intraepithelial lesion on cytologic smear of cervix (LGSIL): Secondary | ICD-10-CM | POA: Diagnosis not present

## 2017-10-22 DIAGNOSIS — N890 Mild vaginal dysplasia: Secondary | ICD-10-CM | POA: Diagnosis not present

## 2017-11-11 DIAGNOSIS — W182XXA Fall in (into) shower or empty bathtub, initial encounter: Secondary | ICD-10-CM | POA: Diagnosis not present

## 2017-11-11 DIAGNOSIS — M25532 Pain in left wrist: Secondary | ICD-10-CM | POA: Diagnosis not present

## 2017-11-11 DIAGNOSIS — S52125A Nondisplaced fracture of head of left radius, initial encounter for closed fracture: Secondary | ICD-10-CM | POA: Diagnosis not present

## 2017-11-12 DIAGNOSIS — M25532 Pain in left wrist: Secondary | ICD-10-CM | POA: Diagnosis not present

## 2017-11-20 DIAGNOSIS — S63502A Unspecified sprain of left wrist, initial encounter: Secondary | ICD-10-CM | POA: Diagnosis not present

## 2017-11-20 DIAGNOSIS — M25532 Pain in left wrist: Secondary | ICD-10-CM | POA: Diagnosis not present

## 2018-02-18 DIAGNOSIS — E039 Hypothyroidism, unspecified: Secondary | ICD-10-CM | POA: Diagnosis not present

## 2018-02-20 DIAGNOSIS — E039 Hypothyroidism, unspecified: Secondary | ICD-10-CM | POA: Diagnosis not present

## 2018-03-12 DIAGNOSIS — I10 Essential (primary) hypertension: Secondary | ICD-10-CM | POA: Diagnosis not present

## 2018-03-12 DIAGNOSIS — M810 Age-related osteoporosis without current pathological fracture: Secondary | ICD-10-CM | POA: Diagnosis not present

## 2018-03-12 DIAGNOSIS — Z13220 Encounter for screening for lipoid disorders: Secondary | ICD-10-CM | POA: Diagnosis not present

## 2018-03-13 DIAGNOSIS — R8290 Unspecified abnormal findings in urine: Secondary | ICD-10-CM | POA: Diagnosis not present

## 2018-03-18 DIAGNOSIS — E7800 Pure hypercholesterolemia, unspecified: Secondary | ICD-10-CM | POA: Diagnosis not present

## 2018-03-18 DIAGNOSIS — R3129 Other microscopic hematuria: Secondary | ICD-10-CM | POA: Diagnosis not present

## 2018-03-18 DIAGNOSIS — Z0000 Encounter for general adult medical examination without abnormal findings: Secondary | ICD-10-CM | POA: Diagnosis not present

## 2018-03-18 DIAGNOSIS — G43111 Migraine with aura, intractable, with status migrainosus: Secondary | ICD-10-CM | POA: Diagnosis not present

## 2018-03-18 DIAGNOSIS — Z23 Encounter for immunization: Secondary | ICD-10-CM | POA: Diagnosis not present

## 2018-08-13 DIAGNOSIS — E039 Hypothyroidism, unspecified: Secondary | ICD-10-CM | POA: Diagnosis not present

## 2018-08-19 DIAGNOSIS — E039 Hypothyroidism, unspecified: Secondary | ICD-10-CM | POA: Diagnosis not present

## 2018-09-03 DIAGNOSIS — H2513 Age-related nuclear cataract, bilateral: Secondary | ICD-10-CM | POA: Diagnosis not present

## 2018-09-10 DIAGNOSIS — E7800 Pure hypercholesterolemia, unspecified: Secondary | ICD-10-CM | POA: Diagnosis not present

## 2018-09-10 DIAGNOSIS — E039 Hypothyroidism, unspecified: Secondary | ICD-10-CM | POA: Diagnosis not present

## 2018-10-14 ENCOUNTER — Other Ambulatory Visit: Payer: Self-pay | Admitting: Obstetrics and Gynecology

## 2018-10-14 DIAGNOSIS — R87622 Low grade squamous intraepithelial lesion on cytologic smear of vagina (LGSIL): Secondary | ICD-10-CM | POA: Diagnosis not present

## 2018-10-14 DIAGNOSIS — M810 Age-related osteoporosis without current pathological fracture: Secondary | ICD-10-CM | POA: Diagnosis not present

## 2018-10-14 DIAGNOSIS — Z1211 Encounter for screening for malignant neoplasm of colon: Secondary | ICD-10-CM | POA: Diagnosis not present

## 2018-10-14 DIAGNOSIS — Z9289 Personal history of other medical treatment: Secondary | ICD-10-CM | POA: Diagnosis not present

## 2018-10-14 DIAGNOSIS — E039 Hypothyroidism, unspecified: Secondary | ICD-10-CM | POA: Diagnosis not present

## 2018-10-14 DIAGNOSIS — Z0000 Encounter for general adult medical examination without abnormal findings: Secondary | ICD-10-CM | POA: Diagnosis not present

## 2018-10-14 DIAGNOSIS — W57XXXD Bitten or stung by nonvenomous insect and other nonvenomous arthropods, subsequent encounter: Secondary | ICD-10-CM | POA: Diagnosis not present

## 2018-10-14 DIAGNOSIS — Z1272 Encounter for screening for malignant neoplasm of vagina: Secondary | ICD-10-CM | POA: Diagnosis not present

## 2018-10-14 DIAGNOSIS — G8929 Other chronic pain: Secondary | ICD-10-CM | POA: Diagnosis not present

## 2018-10-14 DIAGNOSIS — M25512 Pain in left shoulder: Secondary | ICD-10-CM | POA: Diagnosis not present

## 2018-10-14 DIAGNOSIS — Z1231 Encounter for screening mammogram for malignant neoplasm of breast: Secondary | ICD-10-CM

## 2018-10-14 DIAGNOSIS — Z124 Encounter for screening for malignant neoplasm of cervix: Secondary | ICD-10-CM | POA: Diagnosis not present

## 2018-10-14 DIAGNOSIS — M818 Other osteoporosis without current pathological fracture: Secondary | ICD-10-CM | POA: Diagnosis not present

## 2018-10-14 DIAGNOSIS — Z1239 Encounter for other screening for malignant neoplasm of breast: Secondary | ICD-10-CM | POA: Diagnosis not present

## 2018-11-19 DIAGNOSIS — E039 Hypothyroidism, unspecified: Secondary | ICD-10-CM | POA: Diagnosis not present

## 2018-11-25 ENCOUNTER — Ambulatory Visit
Admission: RE | Admit: 2018-11-25 | Discharge: 2018-11-25 | Disposition: A | Discharge status: Home / Self Care | Payer: PPO | Source: Ambulatory Visit | Attending: Obstetrics and Gynecology | Admitting: Obstetrics and Gynecology

## 2018-11-25 ENCOUNTER — Other Ambulatory Visit: Payer: Self-pay

## 2018-11-25 DIAGNOSIS — E039 Hypothyroidism, unspecified: Secondary | ICD-10-CM | POA: Diagnosis not present

## 2018-11-25 DIAGNOSIS — Z1231 Encounter for screening mammogram for malignant neoplasm of breast: Secondary | ICD-10-CM | POA: Diagnosis not present

## 2019-01-14 DIAGNOSIS — L570 Actinic keratosis: Secondary | ICD-10-CM | POA: Diagnosis not present

## 2019-01-14 DIAGNOSIS — D2262 Melanocytic nevi of left upper limb, including shoulder: Secondary | ICD-10-CM | POA: Diagnosis not present

## 2019-01-14 DIAGNOSIS — D2261 Melanocytic nevi of right upper limb, including shoulder: Secondary | ICD-10-CM | POA: Diagnosis not present

## 2019-01-14 DIAGNOSIS — X32XXXA Exposure to sunlight, initial encounter: Secondary | ICD-10-CM | POA: Diagnosis not present

## 2019-01-14 DIAGNOSIS — D225 Melanocytic nevi of trunk: Secondary | ICD-10-CM | POA: Diagnosis not present

## 2019-01-14 DIAGNOSIS — D2271 Melanocytic nevi of right lower limb, including hip: Secondary | ICD-10-CM | POA: Diagnosis not present

## 2019-01-14 DIAGNOSIS — D2272 Melanocytic nevi of left lower limb, including hip: Secondary | ICD-10-CM | POA: Diagnosis not present

## 2019-01-19 DIAGNOSIS — E039 Hypothyroidism, unspecified: Secondary | ICD-10-CM | POA: Diagnosis not present

## 2019-01-26 DIAGNOSIS — E039 Hypothyroidism, unspecified: Secondary | ICD-10-CM | POA: Diagnosis not present

## 2019-02-23 DIAGNOSIS — M810 Age-related osteoporosis without current pathological fracture: Secondary | ICD-10-CM | POA: Diagnosis not present

## 2019-03-23 DIAGNOSIS — E039 Hypothyroidism, unspecified: Secondary | ICD-10-CM | POA: Diagnosis not present

## 2019-04-07 DIAGNOSIS — E039 Hypothyroidism, unspecified: Secondary | ICD-10-CM | POA: Diagnosis not present

## 2019-04-14 DIAGNOSIS — E039 Hypothyroidism, unspecified: Secondary | ICD-10-CM | POA: Insufficient documentation

## 2019-04-15 DIAGNOSIS — E039 Hypothyroidism, unspecified: Secondary | ICD-10-CM | POA: Diagnosis not present

## 2019-04-15 DIAGNOSIS — M810 Age-related osteoporosis without current pathological fracture: Secondary | ICD-10-CM | POA: Diagnosis not present

## 2019-04-15 DIAGNOSIS — Z0000 Encounter for general adult medical examination without abnormal findings: Secondary | ICD-10-CM | POA: Diagnosis not present

## 2019-05-14 DIAGNOSIS — L570 Actinic keratosis: Secondary | ICD-10-CM | POA: Diagnosis not present

## 2019-05-14 DIAGNOSIS — X32XXXA Exposure to sunlight, initial encounter: Secondary | ICD-10-CM | POA: Diagnosis not present

## 2019-05-23 ENCOUNTER — Ambulatory Visit: Payer: Medicare Other | Attending: Internal Medicine

## 2019-05-23 DIAGNOSIS — Z23 Encounter for immunization: Secondary | ICD-10-CM

## 2019-05-23 NOTE — Progress Notes (Signed)
   Covid-19 Vaccination Clinic  Name:  Cathy Hunt    MRN: [id number] DOB: [birth year]  05/23/2019  [name] was observed post Covid-19 immunization for 15 minutes without incidence. She was provided with Vaccine Information Sheet and instruction to access the V-Safe system.   [name] was instructed to call 911 with any severe reactions post vaccine: Marland Kitchen Difficulty breathing  . Swelling of your face and throat  . A fast heartbeat  . A bad rash all over your body  . Dizziness and weakness    Immunizations Administered    Name Date Dose VIS Date Route   Pfizer COVID-19 Vaccine 05/23/2019 11:12 AM 0.3 mL 03/19/2019 Intramuscular   Manufacturer: [place]   Lot: [id number]   [place]: [id number]

## 2019-06-22 ENCOUNTER — Ambulatory Visit: Payer: Medicare Other | Attending: Internal Medicine

## 2019-06-22 DIAGNOSIS — Z23 Encounter for immunization: Secondary | ICD-10-CM

## 2019-06-22 NOTE — Progress Notes (Signed)
   Covid-19 Vaccination Clinic  Name:  Rahaf Balbo    MRN: [id number] DOB: 1951/02/20  06/22/2019  Ms. Hora was observed post Covid-19 immunization for 15 minutes without incident. She was provided with Vaccine Information Sheet and instruction to access the V-Safe system.   Ms. Gittleman was instructed to call 911 with any severe reactions post vaccine: Marland Kitchen Difficulty breathing  . Swelling of face and throat  . A fast heartbeat  . A bad rash all over body  . Dizziness and weakness   Immunizations Administered    Name Date Dose VIS Date Route   Pfizer COVID-19 Vaccine 06/22/2019  9:27 AM 0.3 mL 03/19/2019 Intramuscular   Manufacturer: [place]   Lot: [id number]   [place]: [id number]

## 2019-07-20 DIAGNOSIS — E039 Hypothyroidism, unspecified: Secondary | ICD-10-CM | POA: Diagnosis not present

## 2019-07-27 DIAGNOSIS — E039 Hypothyroidism, unspecified: Secondary | ICD-10-CM | POA: Diagnosis not present

## 2019-09-29 DIAGNOSIS — E039 Hypothyroidism, unspecified: Secondary | ICD-10-CM | POA: Diagnosis not present

## 2019-10-20 ENCOUNTER — Other Ambulatory Visit: Payer: Self-pay | Admitting: Obstetrics and Gynecology

## 2019-10-20 DIAGNOSIS — E039 Hypothyroidism, unspecified: Secondary | ICD-10-CM | POA: Diagnosis not present

## 2019-10-20 DIAGNOSIS — Z1231 Encounter for screening mammogram for malignant neoplasm of breast: Secondary | ICD-10-CM

## 2019-10-20 DIAGNOSIS — Z01419 Encounter for gynecological examination (general) (routine) without abnormal findings: Secondary | ICD-10-CM | POA: Diagnosis not present

## 2019-10-20 DIAGNOSIS — R87613 High grade squamous intraepithelial lesion on cytologic smear of cervix (HGSIL): Secondary | ICD-10-CM | POA: Diagnosis not present

## 2019-10-20 DIAGNOSIS — Z1272 Encounter for screening for malignant neoplasm of vagina: Secondary | ICD-10-CM | POA: Diagnosis not present

## 2019-10-20 DIAGNOSIS — Z1331 Encounter for screening for depression: Secondary | ICD-10-CM | POA: Diagnosis not present

## 2019-10-27 DIAGNOSIS — M810 Age-related osteoporosis without current pathological fracture: Secondary | ICD-10-CM | POA: Diagnosis not present

## 2019-10-27 DIAGNOSIS — E039 Hypothyroidism, unspecified: Secondary | ICD-10-CM | POA: Diagnosis not present

## 2019-10-27 DIAGNOSIS — G43111 Migraine with aura, intractable, with status migrainosus: Secondary | ICD-10-CM | POA: Diagnosis not present

## 2019-10-27 DIAGNOSIS — Z8249 Family history of ischemic heart disease and other diseases of the circulatory system: Secondary | ICD-10-CM | POA: Diagnosis not present

## 2019-10-27 DIAGNOSIS — Z0001 Encounter for general adult medical examination with abnormal findings: Secondary | ICD-10-CM | POA: Diagnosis not present

## 2019-11-02 DIAGNOSIS — R87623 High grade squamous intraepithelial lesion on cytologic smear of vagina (HGSIL): Secondary | ICD-10-CM | POA: Diagnosis not present

## 2019-11-02 DIAGNOSIS — N888 Other specified noninflammatory disorders of cervix uteri: Secondary | ICD-10-CM | POA: Diagnosis not present

## 2019-11-19 ENCOUNTER — Telehealth: Payer: Self-pay

## 2019-11-19 NOTE — Telephone Encounter (Signed)
Referral received for Cathy Hunt from Dr. Margaretmary Dys for recent vaginal pap with HGSIL with negative vaginal biopsies. Called and spoke with Cathy Hunt. She is currently in [place]. Appointment arranged for 12/08/19. Went over what to expect during consult. Directions to the cancer center given.

## 2019-11-24 DIAGNOSIS — E039 Hypothyroidism, unspecified: Secondary | ICD-10-CM | POA: Diagnosis not present

## 2019-12-01 ENCOUNTER — Ambulatory Visit
Admission: RE | Admit: 2019-12-01 | Discharge: 2019-12-01 | Disposition: A | Discharge status: Home / Self Care | Payer: PPO | Source: Ambulatory Visit | Attending: Obstetrics and Gynecology | Admitting: Obstetrics and Gynecology

## 2019-12-01 ENCOUNTER — Other Ambulatory Visit: Payer: Self-pay

## 2019-12-01 DIAGNOSIS — Z1231 Encounter for screening mammogram for malignant neoplasm of breast: Secondary | ICD-10-CM | POA: Insufficient documentation

## 2019-12-03 ENCOUNTER — Telehealth: Payer: Self-pay

## 2019-12-03 ENCOUNTER — Other Ambulatory Visit: Payer: Self-pay | Admitting: Obstetrics and Gynecology

## 2019-12-03 DIAGNOSIS — N6320 Unspecified lump in the left breast, unspecified quadrant: Secondary | ICD-10-CM

## 2019-12-03 DIAGNOSIS — N6459 Other signs and symptoms in breast: Secondary | ICD-10-CM

## 2019-12-03 DIAGNOSIS — N6489 Other specified disorders of breast: Secondary | ICD-10-CM

## 2019-12-03 NOTE — Telephone Encounter (Signed)
Message received from Ms. Coatney that she needs to reschedule her upcoming gyn oncology appointment. Call returned. No answer. Left voicemail with my extension for her to call for reschedule.

## 2019-12-08 ENCOUNTER — Inpatient Hospital Stay: Payer: PPO | Attending: Obstetrics and Gynecology

## 2019-12-15 ENCOUNTER — Ambulatory Visit: Payer: PPO

## 2019-12-15 ENCOUNTER — Ambulatory Visit
Admission: RE | Admit: 2019-12-15 | Discharge: 2019-12-15 | Disposition: A | Discharge status: Home / Self Care | Payer: PPO | Source: Ambulatory Visit | Attending: Obstetrics and Gynecology | Admitting: Obstetrics and Gynecology

## 2019-12-15 ENCOUNTER — Other Ambulatory Visit: Payer: Self-pay

## 2019-12-15 DIAGNOSIS — N6459 Other signs and symptoms in breast: Secondary | ICD-10-CM | POA: Diagnosis not present

## 2019-12-15 DIAGNOSIS — N6489 Other specified disorders of breast: Secondary | ICD-10-CM | POA: Diagnosis not present

## 2019-12-15 DIAGNOSIS — N6320 Unspecified lump in the left breast, unspecified quadrant: Secondary | ICD-10-CM

## 2019-12-15 DIAGNOSIS — R922 Inconclusive mammogram: Secondary | ICD-10-CM | POA: Diagnosis not present

## 2019-12-17 ENCOUNTER — Other Ambulatory Visit: Payer: Self-pay | Admitting: Obstetrics and Gynecology

## 2019-12-17 DIAGNOSIS — N6489 Other specified disorders of breast: Secondary | ICD-10-CM

## 2019-12-20 ENCOUNTER — Telehealth: Payer: Self-pay

## 2019-12-20 NOTE — Telephone Encounter (Signed)
Cathy Hunt did not return call regarding consult on 12/08/19 that she wanted to reschedule. Message sent to scheduling to attempt to reach to see if she would like to reschedule.

## 2019-12-23 ENCOUNTER — Telehealth: Payer: Self-pay

## 2019-12-23 NOTE — Telephone Encounter (Signed)
Call returned to [name] regarding insurance coverage. I have checked with our authorization and they did not have a way of knowing. Instructed Cathy Hunt to contact her insurance company. She will call back to reschedule once she confirms coverage.

## 2019-12-24 ENCOUNTER — Telehealth: Payer: Self-pay

## 2019-12-24 NOTE — Telephone Encounter (Signed)
Received call back from Cathy Hunt. Appointment arranged for 01/12/20 at 1300

## 2020-01-12 ENCOUNTER — Inpatient Hospital Stay: Payer: PPO | Attending: Obstetrics and Gynecology | Admitting: Obstetrics and Gynecology

## 2020-01-12 ENCOUNTER — Encounter: Payer: Self-pay | Admitting: Obstetrics and Gynecology

## 2020-01-12 ENCOUNTER — Other Ambulatory Visit: Payer: Self-pay

## 2020-01-12 VITALS — BP 122/82 | HR 71 | Temp 98.0°F | Resp 20 | Wt 130.4 lb

## 2020-01-12 DIAGNOSIS — Z90710 Acquired absence of both cervix and uterus: Secondary | ICD-10-CM | POA: Insufficient documentation

## 2020-01-12 DIAGNOSIS — R87623 High grade squamous intraepithelial lesion on cytologic smear of vagina (HGSIL): Secondary | ICD-10-CM | POA: Insufficient documentation

## 2020-01-12 DIAGNOSIS — Z79899 Other long term (current) drug therapy: Secondary | ICD-10-CM | POA: Diagnosis not present

## 2020-01-12 DIAGNOSIS — R87613 High grade squamous intraepithelial lesion on cytologic smear of cervix (HGSIL): Secondary | ICD-10-CM | POA: Insufficient documentation

## 2020-01-12 DIAGNOSIS — Z7982 Long term (current) use of aspirin: Secondary | ICD-10-CM | POA: Insufficient documentation

## 2020-01-12 DIAGNOSIS — E039 Hypothyroidism, unspecified: Secondary | ICD-10-CM | POA: Insufficient documentation

## 2020-01-12 DIAGNOSIS — Z90722 Acquired absence of ovaries, bilateral: Secondary | ICD-10-CM | POA: Insufficient documentation

## 2020-01-12 DIAGNOSIS — M1990 Unspecified osteoarthritis, unspecified site: Secondary | ICD-10-CM | POA: Diagnosis not present

## 2020-01-12 MED ORDER — ESTROGENS, CONJUGATED 0.625 MG/GM VA CREA: 0.2500 | TOPICAL_CREAM | VAGINAL | 0 refills | Status: DC

## 2020-01-12 NOTE — Progress Notes (Signed)
Gynecologic Oncology Consult Visit   Referring Provider: Dr. Ouida Sills   Chief Complaint: HSIL on pap   Subjective:  Cathy Hunt is a 69 y.o. G2P1 female who is seen in consultation from Dr. Ouida Sills for HSIL on pap s/p [facility] BS 5/17.   History:  2017- ASCUS + HR HPV w/ repeat pap 6 months later with same result. CKC in 90s. Repeat colposcopy in 07/2015 showed LGSIL in the ECC. Definitive procedure was recommended and she underwent TVH and BS with Dr. Ouida Sills on 08/21/2015 for CIN1 with positive ECC.   09/24/16- ASCUS  09/30/17- LSIL  10/14/18- LSIL  10/20/19- HSIL  Specimen A-Vaginal Biopsy, vaginal cuff 2 o'clock: BENIGN ATROPHIC SQUAMOUS MUCOSA. NEGATIVE FOR DYSPLASIA AND MALIGNANCY.   Specimen B-Vaginal Biopsy, vaginal cuff 3 o'clock: BENIGN ATROPHIC SQUAMOUS MUCOSA. NEGATIVE FOR DYSPLASIA AND MALIGNANCY.   Specimen C-Vaginal Biopsy, vaginal cuff 4 o'clock: TISSUE DID NOT SURVIVE PROCESSING.  Today she denies vaginal bleeding or pain.   Problem List: Patient Active Problem List   Diagnosis Date Noted  . Postoperative state 08/21/2015    Past Medical History: Past Medical History:  Diagnosis Date  . Arthritis   . Cancer ([place])    Cervical  . Complication of anesthesia    difficulty following verbal commands after vein ligation surgery  . Dental crowns present    "a lot"  . Headache    migraines  . Hypothyroidism   . Insomnia   . PONV (postoperative nausea and vomiting)     Past Surgical History: Past Surgical History:  Procedure Laterality Date  . APPENDECTOMY    . BILATERAL SALPINGECTOMY  08/21/2015   Procedure: BILATERAL SALPINGECTOMY;  Surgeon: Boykin Nearing, MD;  Location: [facility] ORS;  Service: Gynecology;;  . BREAST EXCISIONAL BIOPSY Right 2008   neg  . BREAST SURGERY Right    Lumpectomy  . CERVICAL CONE BIOPSY  1984  . COLONOSCOPY WITH PROPOFOL N/A 03/23/2015   Procedure: COLONOSCOPY WITH PROPOFOL;  Surgeon: Hulen Luster, MD;  Location:  [facility];  Service: Gastroenterology;  Laterality: N/A;  . DIAGNOSTIC LAPAROSCOPY    . SHOULDER ARTHROSCOPY Left 12/13/2015   Procedure: arthroscopic debridement, arthroscopic subacromial decompression, and mini-open rotator cuff repair, left shoulder;  Surgeon: Corky Mull, MD;  Location: [place];  Service: Orthopedics;  Laterality: Left;  Patien requests to be first  . TONSILLECTOMY    . TUBAL LIGATION    . VAGINAL HYSTERECTOMY N/A 08/21/2015   Procedure: HYSTERECTOMY VAGINAL;  Surgeon: Boykin Nearing, MD;  Location: [facility] ORS;  Service: Gynecology;  Laterality: N/A;  . VEIN LIGATION AND STRIPPING  1985    Past Gynecologic History:  Post menopausal.  Menarche: age 31 No pmb or hot flashes Not sexually active.  Hx of abnormal pap smears Hx of cone biopsy, tubal ligation, hysterectomy  OB History: . G2P1 OB History  No obstetric history on file.    Family History: Family History  Problem Relation Age of Onset  . Cervical cancer Mother   . Heart attack Father   . Cervical cancer Sister   . Breast cancer Neg Hx     Social History: Social History   Socioeconomic History  . Marital status: Married    Spouse name: Not on file  . Number of children: Not on file  . Years of education: Not on file  . Highest education level: Not on file  Occupational History  . Not on file  Tobacco Use  . Smoking status: Never Smoker  .  Smokeless tobacco: Never Used  Substance and Sexual Activity  . Alcohol use: Yes    Comment: social  . Drug use: No  . Sexual activity: Not on file  Other Topics Concern  . Not on file  Social History Narrative  . Not on file   Social Determinants of Health   Financial Resource Strain:   . Difficulty of Paying Living Expenses: Not on file  Food Insecurity:   . Worried About Charity fundraiser in the Last Year: Not on file  . Ran Out of Food in the Last Year: Not on file  Transportation Needs:   . Lack of Transportation  (Medical): Not on file  . Lack of Transportation (Non-Medical): Not on file  Physical Activity:   . Days of Exercise per Week: Not on file  . Minutes of Exercise per Session: Not on file  Stress:   . Feeling of Stress : Not on file  Social Connections:   . Frequency of Communication with Friends and Family: Not on file  . Frequency of Social Gatherings with Friends and Family: Not on file  . Attends Religious Services: Not on file  . Active Member of Clubs or Organizations: Not on file  . Attends Archivist Meetings: Not on file  . Marital Status: Not on file  Intimate Partner Violence:   . Fear of Current or Ex-Partner: Not on file  . Emotionally Abused: Not on file  . Physically Abused: Not on file  . Sexually Abused: Not on file    Allergies: Allergies  Allergen Reactions  . Codeine Other (See Comments)    "drop in blood pressure"  . Penicillins Swelling  . Phenergan [Promethazine Hcl] Other (See Comments)    "drop in blood pressure"  . Adhesive [Tape] Rash  . Latex Rash    Current Medications: Current Outpatient Medications  Medication Sig Dispense Refill  . aspirin EC 81 MG tablet Take 81 mg by mouth daily.    Marland Kitchen aspirin-acetaminophen-caffeine (EXCEDRIN MIGRAINE) 250-250-65 MG tablet Take 2 tablets by mouth every 6 (six) hours as needed for headache.    . docusate sodium (COLACE) 100 MG capsule Take 1 capsule (100 mg total) by mouth 2 (two) times daily. 30 capsule 0  . etodolac (LODINE) 400 MG tablet Take 1 tablet (400 mg total) by mouth 2 (two) times daily. 60 tablet 3  . levothyroxine (SYNTHROID, LEVOTHROID) 175 MCG tablet Take 150 mcg by mouth daily before breakfast.     . Multiple Vitamins-Minerals (HM MULTIVITAMIN ADULT GUMMY) CHEW Chew 1 Dose by mouth every morning.    . ondansetron (ZOFRAN ODT) 8 MG disintegrating tablet Take 1 tablet (8 mg total) by mouth every 8 (eight) hours as needed for nausea or vomiting. (Patient not taking: Reported on 12/13/2015)  30 tablet 0  . oxyCODONE (ROXICODONE) 5 MG immediate release tablet Take 1-2 tablets (5-10 mg total) by mouth every 4 (four) hours as needed for severe pain. 60 tablet 0  . tiZANidine (ZANAFLEX) 2 MG tablet Take 2 mg by mouth at bedtime as needed for muscle spasms.     No current facility-administered medications for this visit.    General: negative for fevers, changes in weight or night sweats Skin: negative for changes in moles or sores or rash Eyes: negative for changes in vision HEENT: negative for change in hearing, tinnitus, voice changes Pulmonary: negative for dyspnea, orthopnea, productive cough, wheezing Cardiac: negative for palpitations, pain Gastrointestinal: negative for nausea, vomiting, constipation, diarrhea, hematemesis,  hematochezia Genitourinary/Sexual: negative for dysuria, retention, hematuria, incontinence Ob/Gyn:  negative for abnormal bleeding, or pain Musculoskeletal: negative for pain, joint pain, back pain Hematology: negative for easy bruising, abnormal bleeding Neurologic/Psych: negative for seizures, paralysis, weakness, numbness. Positive for headaches  Objective:  Physical Examination:  BP 122/82   Pulse 71   Temp 98 F (36.7 C)   Resp 20   Wt 130 lb 6.4 oz (59.1 kg)   SpO2 100%   BMI 21.05 kg/m     ECOG Performance Status: 0 - Asymptomatic  GENERAL: Patient is a well appearing female in no acute distress NODES:  No cervical, supraclavicular, axillary, or inguinal lymphadenopathy palpated.  LUNGS:  Clear to auscultation bilaterally.  No wheezes or rhonchi. HEART:  Regular rate and rhythm. No murmur appreciated. ABDOMEN:  Soft, nontender.  Positive, normoactive bowel sounds.  MSK:  No focal spinal tenderness to palpation. Full range of motion bilaterally in the upper extremities. EXTREMITIES:  No peripheral edema.   SKIN:  Clear with no obvious rashes or skin changes.  NEURO:  Nonfocal. Well oriented.  Appropriate affect.  Pelvic: Chaperoned  by Nursing EGBUS: no lesions Vagina: atrophy, no lesions, no discharge or bleeding Cervix, Uterus, Ovaries: surgically absent.  Adnexa: no palpable masses Rectovaginal: deferred  Lab Review Labs on site today: none  Radiologic Imaging: None  Procedure note: After signing informed consent and time out colposcopy was performed of the entire vagina with acetic acid.  The old biopsy sites are still healing.  No acetowhite lesions seen concerning for dysplasia. No other abnormal findings.     Assessment:  Cathy Hunt is a 69 y.o. female diagnosed with HSIL PAP s/p TVH/BS for CIN1 in 2017. Prior CKC in the 1990s. Had low grade abnormal PAPs after hysterectomy LSIL/ASCUS, but now HSIL PAP 7/21.  Vaginal biopsies x 3 by Dr. Ouida Sills negative.  No lesions seen on vaginal colposcopy today.    Non smoker and not immunosuppressed.      Plan:   Problem List Items Addressed This Visit      Other   Vaginal Pap smear with HGSIL   RESOLVED: HSIL (high grade squamous intraepithelial lesion) on Pap smear of cervix - Primary     Assured her that we do not see a significant lesion in the vagina and the biopsies were negative.  We discussed options for management including 2 gm premarin cream twice a week to treat vaginal atrophy.  If there is significant dysplasia present the estrogen treatment may make it easier to see. Some of the cytologic atypia may be due to atrophy related inflammation.  She will then return to see [place] in 4 months for repeat PAP and colposcopy.    The patient's diagnosis, an outline of the further diagnostic and laboratory studies which will be required, the recommendation for surgery, and alternatives were discussed with her and her accompanying family members.  All questions were answered to their satisfaction.  A total of 40 minutes were spent with the patient/family today; 40% was spent in education, counseling and coordination of care for HSIL PAP.    Verlon Au,  NP  I personally interviewed and examined the patient. Agreed with the above/below plan of care. I have directly contributed to assessment and plan of care of this patient and educated and discussed with patient and family.  Mellody Drown, MD

## 2020-01-17 ENCOUNTER — Encounter: Payer: Self-pay | Admitting: Obstetrics and Gynecology

## 2020-01-17 DIAGNOSIS — R87623 High grade squamous intraepithelial lesion on cytologic smear of vagina (HGSIL): Secondary | ICD-10-CM | POA: Insufficient documentation

## 2020-02-02 DIAGNOSIS — E039 Hypothyroidism, unspecified: Secondary | ICD-10-CM | POA: Diagnosis not present

## 2020-02-08 ENCOUNTER — Other Ambulatory Visit: Payer: Self-pay | Admitting: Nurse Practitioner

## 2020-02-09 DIAGNOSIS — E039 Hypothyroidism, unspecified: Secondary | ICD-10-CM | POA: Diagnosis not present

## 2020-03-11 ENCOUNTER — Other Ambulatory Visit: Payer: Self-pay | Admitting: Nurse Practitioner

## 2020-04-19 DIAGNOSIS — E039 Hypothyroidism, unspecified: Secondary | ICD-10-CM | POA: Diagnosis not present

## 2020-04-26 DIAGNOSIS — M810 Age-related osteoporosis without current pathological fracture: Secondary | ICD-10-CM | POA: Diagnosis not present

## 2020-04-26 DIAGNOSIS — I83893 Varicose veins of bilateral lower extremities with other complications: Secondary | ICD-10-CM | POA: Diagnosis not present

## 2020-04-26 DIAGNOSIS — E039 Hypothyroidism, unspecified: Secondary | ICD-10-CM | POA: Diagnosis not present

## 2020-05-17 ENCOUNTER — Encounter: Payer: Self-pay | Admitting: Obstetrics and Gynecology

## 2020-05-17 ENCOUNTER — Inpatient Hospital Stay: Payer: PPO | Attending: Obstetrics and Gynecology | Admitting: Obstetrics and Gynecology

## 2020-05-17 VITALS — BP 115/82 | HR 63 | Temp 97.3°F | Resp 20 | Wt 128.6 lb

## 2020-05-17 DIAGNOSIS — R87610 Atypical squamous cells of undetermined significance on cytologic smear of cervix (ASC-US): Secondary | ICD-10-CM | POA: Diagnosis not present

## 2020-05-17 DIAGNOSIS — Z7982 Long term (current) use of aspirin: Secondary | ICD-10-CM | POA: Insufficient documentation

## 2020-05-17 DIAGNOSIS — E039 Hypothyroidism, unspecified: Secondary | ICD-10-CM | POA: Insufficient documentation

## 2020-05-17 DIAGNOSIS — D2261 Melanocytic nevi of right upper limb, including shoulder: Secondary | ICD-10-CM | POA: Diagnosis not present

## 2020-05-17 DIAGNOSIS — Z79890 Hormone replacement therapy (postmenopausal): Secondary | ICD-10-CM | POA: Diagnosis not present

## 2020-05-17 DIAGNOSIS — D225 Melanocytic nevi of trunk: Secondary | ICD-10-CM | POA: Diagnosis not present

## 2020-05-17 DIAGNOSIS — Z90722 Acquired absence of ovaries, bilateral: Secondary | ICD-10-CM | POA: Insufficient documentation

## 2020-05-17 DIAGNOSIS — R87613 High grade squamous intraepithelial lesion on cytologic smear of cervix (HGSIL): Secondary | ICD-10-CM

## 2020-05-17 DIAGNOSIS — L821 Other seborrheic keratosis: Secondary | ICD-10-CM | POA: Diagnosis not present

## 2020-05-17 DIAGNOSIS — N891 Moderate vaginal dysplasia: Secondary | ICD-10-CM | POA: Diagnosis not present

## 2020-05-17 DIAGNOSIS — D2272 Melanocytic nevi of left lower limb, including hip: Secondary | ICD-10-CM | POA: Diagnosis not present

## 2020-05-17 DIAGNOSIS — D2271 Melanocytic nevi of right lower limb, including hip: Secondary | ICD-10-CM | POA: Diagnosis not present

## 2020-05-17 DIAGNOSIS — Z79899 Other long term (current) drug therapy: Secondary | ICD-10-CM | POA: Insufficient documentation

## 2020-05-17 DIAGNOSIS — Z90710 Acquired absence of both cervix and uterus: Secondary | ICD-10-CM | POA: Diagnosis not present

## 2020-05-17 DIAGNOSIS — D2262 Melanocytic nevi of left upper limb, including shoulder: Secondary | ICD-10-CM | POA: Diagnosis not present

## 2020-05-17 NOTE — Progress Notes (Signed)
Gynecologic Oncology Consult Visit   Referring Provider: Dr. Ouida Sills   Chief Complaint: HSIL on pap   Subjective:  Cathy Hunt is a 70 y.o. G2P1 female who is seen in consultation from Dr. Ouida Sills for HSIL on pap s/p [facility] BS 5/17.   Colposcopy on 01/12/20 was negative. She was started on vaginal premarin and re-presents to clinic for Pap/HPV and repeat colposcopy.    Gynecologic History:  Cathy Hunt is a pleasant G2P1 female who is seen in consultation from Dr. Ouida Sills for HSIL on pap s/p [facility] BS 5/17.   CKC in 90s.   2017- ASCUS + HR HPV w/ repeat pap 6 months later with same result.   Repeat colposcopy in 07/2015 showed LGSIL in the ECC. Definitive procedure was recommended and she underwent TVH and BS with Dr. Ouida Sills on 08/21/2015 for CIN1 with positive ECC.   09/24/16- ASCUS  09/30/17- LSIL  10/14/18- LSIL  10/20/19- HSIL  Specimen A-Vaginal Biopsy, vaginal cuff 2 o'clock: BENIGN ATROPHIC SQUAMOUS MUCOSA. NEGATIVE FOR DYSPLASIA AND MALIGNANCY.   Specimen B-Vaginal Biopsy, vaginal cuff 3 o'clock: BENIGN ATROPHIC SQUAMOUS MUCOSA. NEGATIVE FOR DYSPLASIA AND MALIGNANCY.   Specimen C-Vaginal Biopsy, vaginal cuff 4 o'clock: TISSUE DID NOT SURVIVE PROCESSING.  She was seen by Dr. Fransisca Connors and he recommended repeat Pap with colposcopy in 4 months.   Problem List: Patient Active Problem List   Diagnosis Date Noted  . Vaginal Pap smear with HGSIL 01/17/2020  . Postoperative state 08/21/2015    Past Medical History: Past Medical History:  Diagnosis Date  . Arthritis   . Cancer ([place])    Cervical  . Complication of anesthesia    difficulty following verbal commands after vein ligation surgery  . Dental crowns present    "a lot"  . Headache    migraines  . Hypothyroidism   . Insomnia   . PONV (postoperative nausea and vomiting)     Past Surgical History: Past Surgical History:  Procedure Laterality Date  . APPENDECTOMY    . BILATERAL  SALPINGECTOMY  08/21/2015   Procedure: BILATERAL SALPINGECTOMY;  Surgeon: Boykin Nearing, MD;  Location: [facility] ORS;  Service: Gynecology;;  . BREAST EXCISIONAL BIOPSY Right 2008   neg  . BREAST SURGERY Right    Lumpectomy  . CERVICAL CONE BIOPSY  1984  . COLONOSCOPY WITH PROPOFOL N/A 03/23/2015   Procedure: COLONOSCOPY WITH PROPOFOL;  Surgeon: Hulen Luster, MD;  Location: [facility] ENDOSCOPY;  Service: Gastroenterology;  Laterality: N/A;  . DIAGNOSTIC LAPAROSCOPY    . SHOULDER ARTHROSCOPY Left 12/13/2015   Procedure: arthroscopic debridement, arthroscopic subacromial decompression, and mini-open rotator cuff repair, left shoulder;  Surgeon: Corky Mull, MD;  Location: [place];  Service: Orthopedics;  Laterality: Left;  Patien requests to be first  . TONSILLECTOMY    . TUBAL LIGATION    . VAGINAL HYSTERECTOMY N/A 08/21/2015   Procedure: HYSTERECTOMY VAGINAL;  Surgeon: Boykin Nearing, MD;  Location: [facility] ORS;  Service: Gynecology;  Laterality: N/A;  . VAGINAL HYSTERECTOMY    . [place]   Past Gynecologic History:  Post menopausal.  Menarche: age 53 No pmb or hot flashes Not sexually active.  Hx of abnormal pap smears Hx of cone biopsy, tubal ligation, hysterectomy  OB History: . G2P1 OB History  No obstetric history on file.   Family History: Family History  Problem Relation Age of Onset  . Cervical cancer Mother   . Heart attack Father   . Cervical cancer  Sister   . Breast cancer Neg Hx    Social History: Social History   Socioeconomic History  . Marital status: Married    Spouse name: Not on file  . Number of children: Not on file  . Years of education: Not on file  . Highest education level: Not on file  Occupational History  . Not on file  Tobacco Use  . Smoking status: Never Smoker  . Smokeless tobacco: Never Used  Substance and Sexual Activity  . Alcohol use: Yes    Comment: social  . Drug use: No  . Sexual activity:  Not on file  Other Topics Concern  . Not on file  Social History Narrative  . Not on file   Social Determinants of Health   Financial Resource Strain: Not on file  Food Insecurity: Not on file  Transportation Needs: Not on file  Physical Activity: Not on file  Stress: Not on file  Social Connections: Not on file  Intimate Partner Violence: Not on file   Immunization History  Administered Date(s) Administered  . PFIZER(Purple Top)SARS-COV-2 Vaccination 05/23/2019, 06/22/2019   Allergies: Allergies  Allergen Reactions  . Codeine Other (See Comments)    "drop in blood pressure"  . Penicillins Swelling  . Phenergan [Promethazine Hcl] Other (See Comments)    "drop in blood pressure"  . Adhesive [Tape] Rash  . Latex Rash   Current Medications: Current Outpatient Medications  Medication Sig Dispense Refill  . aspirin EC 81 MG tablet Take 81 mg by mouth daily.    Marland Kitchen aspirin-acetaminophen-caffeine (EXCEDRIN MIGRAINE) 250-250-65 MG tablet Take 2 tablets by mouth every 6 (six) hours as needed for headache. (Patient not taking: Reported on 01/12/2020)    . docusate sodium (COLACE) 100 MG capsule Take 1 capsule (100 mg total) by mouth 2 (two) times daily. (Patient not taking: Reported on 01/12/2020) 30 capsule 0  . etodolac (LODINE) 400 MG tablet Take 1 tablet (400 mg total) by mouth 2 (two) times daily. (Patient not taking: Reported on 01/12/2020) 60 tablet 3  . levothyroxine (SYNTHROID, LEVOTHROID) 175 MCG tablet Take 150 mcg by mouth daily before breakfast.     . Multiple Vitamins-Minerals (HM MULTIVITAMIN ADULT GUMMY) CHEW Chew 1 Dose by mouth every morning.    . ondansetron (ZOFRAN ODT) 8 MG disintegrating tablet Take 1 tablet (8 mg total) by mouth every 8 (eight) hours as needed for nausea or vomiting. (Patient not taking: Reported on 12/13/2015) 30 tablet 0  . oxyCODONE (ROXICODONE) 5 MG immediate release tablet Take 1-2 tablets (5-10 mg total) by mouth every 4 (four) hours as needed for  severe pain. (Patient not taking: Reported on 01/12/2020) 60 tablet 0  . PREMARIN vaginal cream INSERT 1/4 APPLICATORFUL VAGINALLY 2 TIMES A WEEK 30 g 1  . tiZANidine (ZANAFLEX) 2 MG tablet Take 2 mg by mouth at bedtime as needed for muscle spasms. (Patient not taking: Reported on 01/12/2020)     No current facility-administered medications for this visit.   Review of Systems General:  no complaints Skin: no complaints Eyes: no complaints HEENT: no complaints Breasts: no complaints Pulmonary: no complaints Cardiac: no complaints Gastrointestinal: no complaints Genitourinary/Sexual: no complaints Ob/Gyn: no complaints Musculoskeletal: no complaints Hematology: no complaints Neurologic/Psych: no complaints   Objective:  Physical Examination:  BP 115/82   Pulse 63   Temp (!) 97.3 F (36.3 C) (Tympanic)   Resp 20   Wt 128 lb 9.6 oz (58.3 kg)   SpO2 100%   BMI 20.76  kg/m     ECOG Performance Status: 0 - Asymptomatic  GENERAL: Patient is a well appearing female in no acute distress EXTREMITIES:  No peripheral edema.   SKIN:  Clear with no obvious rashes or skin changes.  NEURO:  Nonfocal. Well oriented.  Appropriate affect.  Pelvic: chaperoned by nursing.  EGBUS: no lesions Cervix: surgically absent Vagina:  atrophy, no lesions, no discharge or bleeding. Pap obtained Uterus: surgically absent BME: no palpable masses Rectovaginal:  deferred    PROCEDURE: The risks and benefits of the procedure were reviewed and informed consent obtained. Time out was performed. The patient received pre-procedure teaching and expressed understanding. The post-procedure instructions were reviewed with the patient and she expressed understanding. The patient does not have any barriers to learning.  Colposcopy of the upper and lower vagina was performed after application os acetic acid. The findings revealed AWE with punctation (< 1 cm area) in the right upper fornix. Biopsy was performed at  that site. Procedure completed without complications. Hemostasis adequate with AgNO3. The patient tolerated the procedure well.   Post-procedure evaluation the patient was stable without complaints.    Lab Review Labs on site today: none  Radiologic Imaging: None     Assessment:  Kissa Campoy is a 70 y.o. female diagnosed with HSIL PAP s/p TVH/BS for CIN1 in 2017. Prior CKC in the 1990s. Had low grade abnormal PAPs after hysterectomy LSIL/ASCUS, but now HSIL PAP 7/21.  Vaginal biopsies x 3 by Dr. Ouida Sills negative.  S/p vaginal ERT. 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 Biopsy obtained.    Non smoker and not immunosuppressed.      Plan:   Problem List Items Addressed This Visit   None   Visit Diagnoses    HSIL (high grade squamous intraepithelial lesion) on Pap smear of cervix    -  Primary     Follow up Pap/HPV/Biopsy. We will review findings with Dr. Fransisca Connors and see how he would like to proceed based on the results. She can stop vaginal premarin at this point.  The patient's diagnosis, an outline of the further diagnostic and laboratory studies which will be required, the recommendation for surgery, and alternatives were discussed with her and her accompanying family members.  All questions were answered to their satisfaction.  A total of 20 minutes were spent with the patient/family today; >50% was spent in education, counseling and coordination of care for HSIL PAP.    Cathy Higdon Gaetana Michaelis, MD

## 2020-05-18 ENCOUNTER — Other Ambulatory Visit (INDEPENDENT_AMBULATORY_CARE_PROVIDER_SITE_OTHER): Payer: Self-pay | Admitting: Nurse Practitioner

## 2020-05-18 ENCOUNTER — Other Ambulatory Visit: Payer: Self-pay | Admitting: Nurse Practitioner

## 2020-05-18 DIAGNOSIS — I83899 Varicose veins of unspecified lower extremities with other complications: Secondary | ICD-10-CM

## 2020-05-19 ENCOUNTER — Telehealth: Payer: Self-pay | Admitting: *Deleted

## 2020-05-19 ENCOUNTER — Ambulatory Visit (INDEPENDENT_AMBULATORY_CARE_PROVIDER_SITE_OTHER): Payer: PPO

## 2020-05-19 ENCOUNTER — Other Ambulatory Visit: Payer: Self-pay

## 2020-05-19 ENCOUNTER — Ambulatory Visit (INDEPENDENT_AMBULATORY_CARE_PROVIDER_SITE_OTHER): Payer: PPO | Admitting: Nurse Practitioner

## 2020-05-19 VITALS — BP 122/76 | HR 66 | Ht 66.0 in | Wt 127.0 lb

## 2020-05-19 DIAGNOSIS — I83813 Varicose veins of bilateral lower extremities with pain: Secondary | ICD-10-CM | POA: Diagnosis not present

## 2020-05-19 DIAGNOSIS — I83899 Varicose veins of unspecified lower extremities with other complications: Secondary | ICD-10-CM

## 2020-05-19 NOTE — Telephone Encounter (Signed)
Tammie with [facility] called to report that the Thin Prep for this patient was quantity not sufficient for HPV testing

## 2020-05-22 LAB — IGP, APTIMA HPV

## 2020-05-22 LAB — SURGICAL PATHOLOGY

## 2020-05-26 ENCOUNTER — Encounter (INDEPENDENT_AMBULATORY_CARE_PROVIDER_SITE_OTHER): Payer: Self-pay | Admitting: Nurse Practitioner

## 2020-05-26 NOTE — Progress Notes (Signed)
Subjective:    Patient ID: Cathy Hunt, female    DOB: 09-Sep-1950, 70 y.o.   MRN: [id number] Chief Complaint  Patient presents with  . New Patient (Initial Visit)    Cathy Hunt. Systematic VV LE reflux    The patient is seen for evaluation of symptomatic varicose veins. The patient relates burning and stinging which worsened steadily throughout the course of the day, particularly with standing. The left is worse than the right The patient also notes an aching and throbbing pain over the varicosities, particularly with prolonged dependent positions. The symptoms are significantly improved with elevation.  The patient also notes that during hot weather the symptoms are greatly intensified. The patient states the pain from the varicose veins interferes with work, daily exercise, shopping and household maintenance. At this point, the symptoms are persistent and severe enough that they're having a negative impact on lifestyle and are interfering with daily activities.  There is no history of DVT, PE or superficial thrombophlebitis. There is no history of ulceration or hemorrhage. The patient denies a significant family history of varicose veins.   The patient has worn graduated compression in the past, yet these fail to alleviate symptoms. At the present time the patient has been using over-the-counter analgesics. There is a history of prior ablation of the bilateral great saphenous veins.   Noninvasive studies today show prior ablation and stripping of the previous bilateral great saphenous veins. There is no evidence of DVT or superficial venous thrombosis seen bilaterally. Right lower extremity has evidence of deep venous insufficiency in the popliteal vein. Left lower extremity has evidence of deep venous insufficiency in the common femoral vein extending to the popliteal vein. The small saphenous vein on the left lower extremity also has evidence of significant reflux.   Review of Systems   Cardiovascular: Positive for leg swelling.       Tender varicosities  All other systems reviewed and are negative.      Objective:   Physical Exam Vitals reviewed.  HENT:     Head: Normocephalic.  Cardiovascular:     Rate and Rhythm: Normal rate.     Pulses: Normal pulses.  Pulmonary:     Effort: Pulmonary effort is normal.  Abdominal:     General: Abdomen is flat.  Skin:    General: Skin is warm and dry.  Neurological:     Mental Status: She is alert and oriented to person, place, and time.  Psychiatric:        Mood and Affect: Mood normal.        Behavior: Behavior normal.        Thought Content: Thought content normal.        Judgment: Judgment normal.     BP 122/76   Pulse 66   Ht 5\' 6"  (1.676 m)   Wt 127 lb (57.6 kg)   BMI 20.50 kg/m   Past Medical History:  Diagnosis Date  . Arthritis   . Cancer ([place])    Cervical  . Complication of anesthesia    difficulty following verbal commands after vein ligation surgery  . Dental crowns present    "a lot"  . Headache    migraines  . Hypothyroidism   . Insomnia   . PONV (postoperative nausea and vomiting)     Social History   Socioeconomic History  . Marital status: Married    Spouse name: Not on file  . Number of children: Not on file  . Years of education:  Not on file  . Highest education level: Not on file  Occupational History  . Not on file  Tobacco Use  . Smoking status: Never Smoker  . Smokeless tobacco: Never Used  Substance and Sexual Activity  . Alcohol use: Yes    Comment: social  . Drug use: No  . Sexual activity: Not on file  Other Topics Concern  . Not on file  Social History Narrative  . Not on file   Social Determinants of Health   Financial Resource Strain: Not on file  Food Insecurity: Not on file  Transportation Needs: Not on file  Physical Activity: Not on file  Stress: Not on file  Social Connections: Not on file  Intimate Partner Violence: Not on file    Past  Surgical History:  Procedure Laterality Date  . APPENDECTOMY    . BILATERAL SALPINGECTOMY  08/21/2015   Procedure: BILATERAL SALPINGECTOMY;  Surgeon: Boykin Nearing, MD;  Location: [facility] ORS;  Service: Gynecology;;  . BREAST EXCISIONAL BIOPSY Right 2008   neg  . BREAST SURGERY Right    Lumpectomy  . CERVICAL CONE BIOPSY  1984  . COLONOSCOPY WITH PROPOFOL N/A 03/23/2015   Procedure: COLONOSCOPY WITH PROPOFOL;  Surgeon: Hulen Luster, MD;  Location: [facility] ENDOSCOPY;  Service: Gastroenterology;  Laterality: N/A;  . DIAGNOSTIC LAPAROSCOPY    . SHOULDER ARTHROSCOPY Left 12/13/2015   Procedure: arthroscopic debridement, arthroscopic subacromial decompression, and mini-open rotator cuff repair, left shoulder;  Surgeon: Corky Mull, MD;  Location: [place];  Service: Orthopedics;  Laterality: Left;  Patien requests to be first  . TONSILLECTOMY    . TUBAL LIGATION    . VAGINAL HYSTERECTOMY N/A 08/21/2015   Procedure: HYSTERECTOMY VAGINAL;  Surgeon: Boykin Nearing, MD;  Location: [facility] ORS;  Service: Gynecology;  Laterality: N/A;  . VAGINAL HYSTERECTOMY    . VEIN LIGATION AND STRIPPING  1985    Family History  Problem Relation Age of Onset  . Cervical cancer Mother   . Heart attack Father   . Cervical cancer Sister   . Breast cancer Neg Hx     Allergies  Allergen Reactions  . Codeine Other (See Comments)    "drop in blood pressure"  . Penicillins Swelling  . Phenergan [Promethazine Hcl] Other (See Comments)    "drop in blood pressure"  . Promethazine Other (See Comments)    Low BP  . Adhesive [Tape] Rash  . Latex Rash    CBC Latest Ref Rng & Units 08/22/2015 08/15/2015  WBC 3.6 - 11.0 K/uL 10.9 7.8  Hemoglobin 12.0 - 16.0 g/dL 12.8 14.2  Hematocrit 35.0 - 47.0 % 37.6 42.2  Platelets 150 - 440 K/uL 179 228      CMP     Component Value Date/Time   NA 139 08/22/2015 0413   K 4.7 08/22/2015 0413   CL 106 08/22/2015 0413   CO2 29 08/22/2015 0413   GLUCOSE 111  (H) 08/22/2015 0413   BUN 12 08/22/2015 0413   CREATININE 0.62 08/22/2015 0413   CALCIUM 9.5 08/22/2015 0413   GFRNONAA >60 08/22/2015 0413   GFRAA >60 08/22/2015 0413     No results found.     Assessment & Plan:   1. Varicose veins of both lower extremities with pain Recommend  I have reviewed my previous  discussion with the patient regarding  varicose veins and why they cause symptoms. Patient will continue  wearing graduated compression stockings class 1 on a daily basis, beginning  first thing in the morning and removing them in the evening.    In addition, behavioral modification including elevation during the day was again discussed and this will continue.  The patient has utilized over the counter pain medications and has been exercising.  However, at this time conservative therapy failed, these tactics have not alleviated the patient's symptoms of leg pain and swelling  Recommend: laser ablation of the left small saphenous veins to eliminate the symptoms of pain and swelling of the lower extremities caused by the severe superficial venous reflux disease.    Current Outpatient Medications on File Prior to Visit  Medication Sig Dispense Refill  . aspirin EC 81 MG tablet Take 81 mg by mouth daily.    . Calcium Carb-Cholecalciferol 1000-800 MG-UNIT TABS Take 2 capsules by mouth daily.    Marland Kitchen levothyroxine (SYNTHROID, LEVOTHROID) 175 MCG tablet Take 150 mcg by mouth daily before breakfast.     . aspirin-acetaminophen-caffeine (EXCEDRIN MIGRAINE) 250-250-65 MG tablet Take 2 tablets by mouth every 6 (six) hours as needed for headache. (Patient not taking: No sig reported)    . docusate sodium (COLACE) 100 MG capsule Take 1 capsule (100 mg total) by mouth 2 (two) times daily. (Patient not taking: No sig reported) 30 capsule 0  . etodolac (LODINE) 400 MG tablet Take 1 tablet (400 mg total) by mouth 2 (two) times daily. (Patient not taking: No sig reported) 60 tablet 3  . Multiple  Vitamins-Minerals (HM MULTIVITAMIN ADULT GUMMY) CHEW Chew 1 Dose by mouth every morning. (Patient not taking: Reported on 05/19/2020)    . ondansetron (ZOFRAN ODT) 8 MG disintegrating tablet Take 1 tablet (8 mg total) by mouth every 8 (eight) hours as needed for nausea or vomiting. (Patient not taking: No sig reported) 30 tablet 0  . oxyCODONE (ROXICODONE) 5 MG immediate release tablet Take 1-2 tablets (5-10 mg total) by mouth every 4 (four) hours as needed for severe pain. (Patient not taking: No sig reported) 60 tablet 0  . PREMARIN vaginal cream INSERT 1/4 APPLICATORFUL VAGINALLY 2 TIMES A WEEK (Patient not taking: Reported on 05/19/2020) 30 g 2  . tiZANidine (ZANAFLEX) 2 MG tablet Take 2 mg by mouth at bedtime as needed for muscle spasms. (Patient not taking: No sig reported)     No current facility-administered medications on file prior to visit.    There are no Patient Instructions on file for this visit. No follow-ups on file.   Kris Hartmann, NP

## 2020-06-07 ENCOUNTER — Telehealth: Payer: Self-pay | Admitting: Nurse Practitioner

## 2020-06-07 ENCOUNTER — Telehealth: Payer: Self-pay | Admitting: *Deleted

## 2020-06-07 ENCOUNTER — Encounter: Payer: Self-pay | Admitting: Nurse Practitioner

## 2020-06-07 ENCOUNTER — Other Ambulatory Visit: Payer: Self-pay | Admitting: Nurse Practitioner

## 2020-06-07 DIAGNOSIS — R87613 High grade squamous intraepithelial lesion on cytologic smear of cervix (HGSIL): Secondary | ICD-10-CM

## 2020-06-07 MED ORDER — FLUOROURACIL 5 % EX CREA: TOPICAL_CREAM | CUTANEOUS | 0 refills | Status: AC

## 2020-06-07 NOTE — Telephone Encounter (Signed)
Cathy Hunt, Pharmacist called asking for return call to discuss Fluorouracil intravaginally as she sees nothing in the [place] about this application. [phone] and ask for [facility] the pharmacist

## 2020-06-07 NOTE — Telephone Encounter (Signed)
Reached out to patient to discuss biopsy results and recommendation for efudex. No answer. Left voicemail to return call.

## 2020-06-07 NOTE — Telephone Encounter (Signed)
Spoke to pharmacist. She will dispense medication as prescribed.

## 2020-06-07 NOTE — Progress Notes (Unsigned)
Spoke to patient. She will come by clinic on Friday at 9:15 for education regarding efudex and to pick up an applicator.

## 2020-06-09 ENCOUNTER — Inpatient Hospital Stay: Payer: PPO | Attending: Nurse Practitioner | Admitting: Nurse Practitioner

## 2020-06-09 ENCOUNTER — Other Ambulatory Visit: Payer: Self-pay

## 2020-06-09 DIAGNOSIS — Z79899 Other long term (current) drug therapy: Secondary | ICD-10-CM | POA: Insufficient documentation

## 2020-06-09 DIAGNOSIS — R87623 High grade squamous intraepithelial lesion on cytologic smear of vagina (HGSIL): Secondary | ICD-10-CM | POA: Diagnosis not present

## 2020-06-09 DIAGNOSIS — C52 Malignant neoplasm of vagina: Secondary | ICD-10-CM | POA: Insufficient documentation

## 2020-06-09 NOTE — Progress Notes (Signed)
Gynecologic Oncology Consult Visit   Referring Provider: Dr. Ouida Sills   Chief Complaint: HSIL on pap   Subjective:  Cathy Hunt is a 70 y.o. G2P1 female who is seen in consultation from Dr. Ouida Sills for HSIL on pap s/p TVH BS 5/17 who returns to clinic for discussion of management of HSIL.   Colposcopy on 01/12/20 was negative. Started vaginal premarin   05/17/20- LSIL pap. Colposcopy and biopsy performed.   A. VAGINA, RIGHT FORNIX; BIOPSY:  - HIGH-GRADE SQUAMOUS INTRAEPITHELIAL LESION (VAIN 2).  She feels well today and has picked up prescription for efudex.    Gynecologic History:  Cathy Hunt is a pleasant G2P1 female who is seen in consultation from Dr. Ouida Sills for HSIL on pap s/p [facility] BS 5/17.   CKC in 90s.   2017- ASCUS + HR HPV w/ repeat pap 6 months later with same result.   Repeat colposcopy in 07/2015 showed LGSIL in the ECC. Definitive procedure was recommended and she underwent TVH and BS with Dr. Ouida Sills on 08/21/2015 for CIN1 with positive ECC.   09/24/16- ASCUS  09/30/17- LSIL  10/14/18- LSIL  10/20/19- HSIL  Specimen A-Vaginal Biopsy, vaginal cuff 2 o'clock: BENIGN ATROPHIC SQUAMOUS MUCOSA. NEGATIVE FOR DYSPLASIA AND MALIGNANCY.   Specimen B-Vaginal Biopsy, vaginal cuff 3 o'clock: BENIGN ATROPHIC SQUAMOUS MUCOSA. NEGATIVE FOR DYSPLASIA AND MALIGNANCY.   Specimen C-Vaginal Biopsy, vaginal cuff 4 o'clock: TISSUE DID NOT SURVIVE PROCESSING.  Colposcopy on 01/12/20 was negative. Started vaginal premarin   05/17/20- LSIL.   A. VAGINA, RIGHT FORNIX; BIOPSY:  - HIGH-GRADE SQUAMOUS INTRAEPITHELIAL LESION (VAIN 2).     Problem List: Patient Active Problem List   Diagnosis Date Noted  . Vaginal Pap smear with HGSIL 01/17/2020  . Hypothyroidism, adult 04/14/2019  . Postmenopausal osteoporosis 01/16/2016  . Complete tear of left rotator cuff 11/17/2015  . Rotator cuff tendinitis, left 11/17/2015  . Postoperative state 08/21/2015    Past  Medical History: Past Medical History:  Diagnosis Date  . Arthritis   . Cancer ([place])    Cervical  . Complication of anesthesia    difficulty following verbal commands after vein ligation surgery  . Dental crowns present    "a lot"  . Headache    migraines  . Hypothyroidism   . Insomnia   . PONV (postoperative nausea and vomiting)     Past Surgical History: Past Surgical History:  Procedure Laterality Date  . APPENDECTOMY    . BILATERAL SALPINGECTOMY  08/21/2015   Procedure: BILATERAL SALPINGECTOMY;  Surgeon: Boykin Nearing, MD;  Location: [facility] ORS;  Service: Gynecology;;  . BREAST EXCISIONAL BIOPSY Right 2008   neg  . BREAST SURGERY Right    Lumpectomy  . CERVICAL CONE BIOPSY  1984  . COLONOSCOPY WITH PROPOFOL N/A 03/23/2015   Procedure: COLONOSCOPY WITH PROPOFOL;  Surgeon: Hulen Luster, MD;  Location: [facility] ENDOSCOPY;  Service: Gastroenterology;  Laterality: N/A;  . DIAGNOSTIC LAPAROSCOPY    . SHOULDER ARTHROSCOPY Left 12/13/2015   Procedure: arthroscopic debridement, arthroscopic subacromial decompression, and mini-open rotator cuff repair, left shoulder;  Surgeon: Corky Mull, MD;  Location: [place];  Service: Orthopedics;  Laterality: Left;  Patien requests to be first  . TONSILLECTOMY    . TUBAL LIGATION    . VAGINAL HYSTERECTOMY N/A 08/21/2015   Procedure: HYSTERECTOMY VAGINAL;  Surgeon: Boykin Nearing, MD;  Location: [facility] ORS;  Service: Gynecology;  Laterality: N/A;  . VAGINAL HYSTERECTOMY    . [place]  Past Gynecologic History:  Post menopausal.  Menarche: age 25 No pmb or hot flashes Not sexually active.  Hx of abnormal pap smears Hx of cone biopsy, tubal ligation, hysterectomy  OB History: . G2P1 OB History  No obstetric history on file.   Family History: Family History  Problem Relation Age of Onset  . Cervical cancer Mother   . Heart attack Father   . Cervical cancer Sister   . Breast cancer Neg Hx     Social History: Social History   Socioeconomic History  . Marital status: Married    Spouse name: Not on file  . Number of children: Not on file  . Years of education: Not on file  . Highest education level: Not on file  Occupational History  . Not on file  Tobacco Use  . Smoking status: Never Smoker  . Smokeless tobacco: Never Used  Substance and Sexual Activity  . Alcohol use: Yes    Comment: social  . Drug use: No  . Sexual activity: Not on file  Other Topics Concern  . Not on file  Social History Narrative  . Not on file   Social Determinants of Health   Financial Resource Strain: Not on file  Food Insecurity: Not on file  Transportation Needs: Not on file  Physical Activity: Not on file  Stress: Not on file  Social Connections: Not on file  Intimate Partner Violence: Not on file   Immunization History  Administered Date(s) Administered  . PFIZER(Purple Top)SARS-COV-2 Vaccination 05/23/2019, 06/22/2019   Allergies: Allergies  Allergen Reactions  . Codeine Other (See Comments)    "drop in blood pressure"  . Penicillins Swelling  . Phenergan [Promethazine Hcl] Other (See Comments)    "drop in blood pressure"  . Promethazine Other (See Comments)    Low BP  . Adhesive [Tape] Rash  . Latex Rash   Current Medications: Current Outpatient Medications  Medication Sig Dispense Refill  . aspirin EC 81 MG tablet Take 81 mg by mouth daily.    Marland Kitchen aspirin-acetaminophen-caffeine (EXCEDRIN MIGRAINE) 250-250-65 MG tablet Take 2 tablets by mouth every 6 (six) hours as needed for headache. (Patient not taking: No sig reported)    . Calcium Carb-Cholecalciferol 1000-800 MG-UNIT TABS Take 2 capsules by mouth daily.    Marland Kitchen docusate sodium (COLACE) 100 MG capsule Take 1 capsule (100 mg total) by mouth 2 (two) times daily. (Patient not taking: No sig reported) 30 capsule 0  . etodolac (LODINE) 400 MG tablet Take 1 tablet (400 mg total) by mouth 2 (two) times daily. (Patient not  taking: No sig reported) 60 tablet 3  . fluorouracil (EFUDEX) 5 % cream Apply topically once a week for 10 doses. Insert applicator 7/8-6/7 full of cream (1.5-2 grams) into vagina at night once a week for 10 weeks 40 g 0  . levothyroxine (SYNTHROID, LEVOTHROID) 175 MCG tablet Take 150 mcg by mouth daily before breakfast.     . Multiple Vitamins-Minerals (HM MULTIVITAMIN ADULT GUMMY) CHEW Chew 1 Dose by mouth every morning. (Patient not taking: Reported on 05/19/2020)    . ondansetron (ZOFRAN ODT) 8 MG disintegrating tablet Take 1 tablet (8 mg total) by mouth every 8 (eight) hours as needed for nausea or vomiting. (Patient not taking: No sig reported) 30 tablet 0  . oxyCODONE (ROXICODONE) 5 MG immediate release tablet Take 1-2 tablets (5-10 mg total) by mouth every 4 (four) hours as needed for severe pain. (Patient not taking: No sig reported) 60 tablet 0  .  PREMARIN vaginal cream INSERT 1/4 APPLICATORFUL VAGINALLY 2 TIMES A WEEK (Patient not taking: Reported on 05/19/2020) 30 g 2  . tiZANidine (ZANAFLEX) 2 MG tablet Take 2 mg by mouth at bedtime as needed for muscle spasms. (Patient not taking: No sig reported)     No current facility-administered medications for this visit.   Review of Systems General:  no complaints Skin: no complaints Eyes: no complaints HEENT: no complaints Breasts: no complaints Pulmonary: no complaints Cardiac: no complaints Gastrointestinal: no complaints Genitourinary/Sexual: no complaints Ob/Gyn: no complaints Musculoskeletal: no complaints Hematology: no complaints Neurologic/Psych: no complaints   Objective:  Physical Examination:  There were no vitals taken for this visit.    ECOG Performance Status: 0 - Asymptomatic  GENERAL: Patient is a well appearing female in no acute distress  SKIN:  Clear with no obvious rashes or skin changes.  NEURO:  Nonfocal. Well oriented.  Appropriate affect.  Pelvic: deferred  Lab Review Labs on site today:  none  Radiologic Imaging: None     Assessment:  Cathy Hunt is a 70 y.o. female diagnosed with HSIL PAP s/p TVH/BS for CIN1 in 2017. Prior CKC in the 1990s. Had low grade abnormal PAPs after hysterectomy LSIL/ASCUS, but now HSIL PAP 7/21.  Vaginal biopsies x 3 by Dr. Ouida Sills negative.  S/p vaginal ERT. 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 Biopsy consistent with HSIL.   Non smoker and not immunosuppressed.      Plan:   Problem List Items Addressed This Visit      Other   Vaginal Pap smear with HGSIL - Primary     Discussed risks of Efudex including but not limited to: application site scaling, pain, edema, burning, pruitis, stinging, and/or dryness or ulceration. Teaching provided today. Encouraged using vaseline/desitin to non treatment surface. Use tampon after insertion of medication. Provided patient with applicator. Douche with tap water morning after treatment.   Plan for weekly application of vaginal efudex x 10 weeks. 4 weeks after completion of treatment will plan for her to return to clinic for reevaluation and pap and colposcopy. Will see her back prior to her third week of treatment for pelvic to assess tolerance.   The patient's diagnosis, an outline of the further diagnostic and laboratory studies which will be required, the recommendation for surgery, and alternatives were discussed with her and her accompanying family members.  All questions were answered to their satisfaction.  Beckey Rutter, DNP, AGNP-C [place] at [facility] [phone] (clinic)

## 2020-06-09 NOTE — Patient Instructions (Signed)
Patient Information - Vaginal Efudex Cream ° °Efudex (5FU) cream is used in the vagina to treat condyloma (warts) or dysplasia (abnormal tissue) in the vagina. It works by causing a chemical burn, which in turn will cause the top layer of tissue to come off. Since the cream causes burns, it must be used with great care. Please follow these instructions closely. °1. Apply Desitin Ointment or Vaseline (bought at the drug store) around normal surfaces of the vaginal opening °2. At bedtime, insert applicator 1/3-1/2 full of 5-FU (1.5-2 grams) with cream deeply into the vagina. If you often get up in the middle of the night you can also insert a tampon to keep the cream from running out. We recommend using a tampon even if you don't get up in the middle of the night. °3. In the morning, remove the tampon, take a shower and gently wash the vulvar and vagina area well right away. You may also douche.  °4. With vaginal treatment anticipate a watery discharge during the time of treatment. °5. If you notice any soreness or rawness of the labia, keep the area coated with Desitin or Vaseline. If the sore or raw labia are allowed to come in direct contact, they may stick together. This may cause pain and/or difficulty urinating (peeing). °6. If you have soreness of the labia, you may soak in a warm tub of water for 20 minutes at a time or try putting ice on it. °7. Use Efudex at bedtime for once every week for 10 weeks. Do not use it during your period. Do not use it during the day. °8. Return to the clinic for an examination 2-4 weeks for an exam to make sure you are not developing an ulcer.  °9. Avoid sexual intercourse for at least 3 days after applying 5-fluorouracil.  °10. Do not hesitate to call the clinic if you have problems while using the medicine. ° °  °

## 2020-06-23 ENCOUNTER — Inpatient Hospital Stay (HOSPITAL_BASED_OUTPATIENT_CLINIC_OR_DEPARTMENT_OTHER): Payer: PPO | Admitting: Nurse Practitioner

## 2020-06-23 ENCOUNTER — Encounter: Payer: Self-pay | Admitting: Nurse Practitioner

## 2020-06-23 ENCOUNTER — Other Ambulatory Visit: Payer: Self-pay

## 2020-06-23 VITALS — BP 126/79 | HR 63 | Temp 97.1°F | Resp 20 | Ht 66.0 in | Wt 128.0 lb

## 2020-06-23 DIAGNOSIS — R87623 High grade squamous intraepithelial lesion on cytologic smear of vagina (HGSIL): Secondary | ICD-10-CM

## 2020-06-23 DIAGNOSIS — C52 Malignant neoplasm of vagina: Secondary | ICD-10-CM | POA: Diagnosis not present

## 2020-06-23 NOTE — Progress Notes (Signed)
Gynecologic Oncology Progress Visit   Referring Provider: Dr. Ouida Sills   Chief Complaint: HSIL on pap   Subjective:  Cathy Hunt is a 70 y.o. G2P1 female who is seen in consultation from Dr. Ouida Sills for HSIL on pap s/p TVH BS 5/17 who returns to clinic for 2 week evaluation of vaginal efudex.   She has started vaginal efudex and has completed 2 weeks of treatment. Tonight is her scheduled third treatment. Tolerating well without side effects. Douching the following morning. Overall feels well with treatment.        Gynecologic History:  Cathy Hunt is a pleasant G2P1 female who is seen in consultation from Dr. Ouida Sills for HSIL on pap s/p [facility] BS 5/17.   CKC in 90s.   2017- ASCUS + HR HPV w/ repeat pap 6 months later with same result.   Repeat colposcopy in 07/2015 showed LGSIL in the ECC. Definitive procedure was recommended and she underwent TVH and BS with Dr. Ouida Sills on 08/21/2015 for CIN1 with positive ECC.   09/24/16- ASCUS  09/30/17- LSIL  10/14/18- LSIL  10/20/19- HSIL  Specimen A-Vaginal Biopsy, vaginal cuff 2 o'clock: BENIGN ATROPHIC SQUAMOUS MUCOSA. NEGATIVE FOR DYSPLASIA AND MALIGNANCY.   Specimen B-Vaginal Biopsy, vaginal cuff 3 o'clock: BENIGN ATROPHIC SQUAMOUS MUCOSA. NEGATIVE FOR DYSPLASIA AND MALIGNANCY.   Specimen C-Vaginal Biopsy, vaginal cuff 4 o'clock: TISSUE DID NOT SURVIVE PROCESSING.  Colposcopy on 01/12/20 was negative. Started vaginal premarin   05/17/20- LSIL.   A. VAGINA, RIGHT FORNIX; BIOPSY:  - HIGH-GRADE SQUAMOUS INTRAEPITHELIAL LESION (VAIN 2).  Colposcopy on 01/12/20 was negative. Started vaginal premarin   05/17/20- LSIL pap. Colposcopy and biopsy performed.   A. VAGINA, RIGHT FORNIX; BIOPSY:  - HIGH-GRADE SQUAMOUS INTRAEPITHELIAL LESION (VAIN 2).   Problem List: Patient Active Problem List   Diagnosis Date Noted  . Vaginal Pap smear with HGSIL 01/17/2020  . Hypothyroidism, adult 04/14/2019  . Postmenopausal  osteoporosis 01/16/2016  . Complete tear of left rotator cuff 11/17/2015  . Rotator cuff tendinitis, left 11/17/2015  . Postoperative state 08/21/2015    Past Medical History: Past Medical History:  Diagnosis Date  . Arthritis   . Cancer ([place])    Cervical  . Complication of anesthesia    difficulty following verbal commands after vein ligation surgery  . Dental crowns present    "a lot"  . Headache    migraines  . Hypothyroidism   . Insomnia   . PONV (postoperative nausea and vomiting)     Past Surgical History: Past Surgical History:  Procedure Laterality Date  . APPENDECTOMY    . BILATERAL SALPINGECTOMY  08/21/2015   Procedure: BILATERAL SALPINGECTOMY;  Surgeon: Boykin Nearing, MD;  Location: [facility] ORS;  Service: Gynecology;;  . BREAST EXCISIONAL BIOPSY Right 2008   neg  . BREAST SURGERY Right    Lumpectomy  . CERVICAL CONE BIOPSY  1984  . COLONOSCOPY WITH PROPOFOL N/A 03/23/2015   Procedure: COLONOSCOPY WITH PROPOFOL;  Surgeon: Hulen Luster, MD;  Location: [facility] ENDOSCOPY;  Service: Gastroenterology;  Laterality: N/A;  . DIAGNOSTIC LAPAROSCOPY    . SHOULDER ARTHROSCOPY Left 12/13/2015   Procedure: arthroscopic debridement, arthroscopic subacromial decompression, and mini-open rotator cuff repair, left shoulder;  Surgeon: Corky Mull, MD;  Location: [place];  Service: Orthopedics;  Laterality: Left;  Patien requests to be first  . TONSILLECTOMY    . TUBAL LIGATION    . VAGINAL HYSTERECTOMY N/A 08/21/2015   Procedure: HYSTERECTOMY VAGINAL;  Surgeon: Boykin Nearing, MD;  Location: [facility] ORS;  Service: Gynecology;  Laterality: N/A;  . VAGINAL HYSTERECTOMY    . [place]   Past Gynecologic History:  Post menopausal.  Menarche: age 15 No pmb or hot flashes Not sexually active.  Hx of abnormal pap smears Hx of cone biopsy, tubal ligation, hysterectomy  OB History: . G2P1 OB History  No obstetric history on file.   Family  History: Family History  Problem Relation Age of Onset  . Cervical cancer Mother   . Heart attack Father   . Cervical cancer Sister   . Breast cancer Neg Hx    Social History: Social History   Socioeconomic History  . Marital status: Married    Spouse name: Not on file  . Number of children: Not on file  . Years of education: Not on file  . Highest education level: Not on file  Occupational History  . Not on file  Tobacco Use  . Smoking status: Never Smoker  . Smokeless tobacco: Never Used  Substance and Sexual Activity  . Alcohol use: Yes    Comment: social  . Drug use: No  . Sexual activity: Not on file  Other Topics Concern  . Not on file  Social History Narrative  . Not on file   Social Determinants of Health   Financial Resource Strain: Not on file  Food Insecurity: Not on file  Transportation Needs: Not on file  Physical Activity: Not on file  Stress: Not on file  Social Connections: Not on file  Intimate Partner Violence: Not on file   Immunization History  Administered Date(s) Administered  . Influenza Inj Mdck Quad Pf 01/05/2016  . Influenza Split 01/05/2015  . Influenza-Unspecified 02/05/2017, 01/06/2018  . PFIZER(Purple Top)SARS-COV-2 Vaccination 05/23/2019, 06/22/2019  . Pneumococcal Conjugate-13 02/08/2016  . Pneumococcal Polysaccharide-23 01/05/2015   Allergies: Allergies  Allergen Reactions  . Codeine Other (See Comments)    "drop in blood pressure"  . Penicillins Swelling  . Phenergan [Promethazine Hcl] Other (See Comments)    "drop in blood pressure"  . Promethazine Other (See Comments)    Low BP  . Adhesive [Tape] Rash  . Latex Rash   Current Medications: Current Outpatient Medications  Medication Sig Dispense Refill  . aspirin EC 81 MG tablet Take 81 mg by mouth daily.    Marland Kitchen aspirin-acetaminophen-caffeine (EXCEDRIN MIGRAINE) 250-250-65 MG tablet Take 2 tablets by mouth every 6 (six) hours as needed for headache.    . Calcium  Carb-Cholecalciferol 1000-800 MG-UNIT TABS Take 2 capsules by mouth daily.    Marland Kitchen docusate sodium (COLACE) 100 MG capsule Take 1 capsule (100 mg total) by mouth 2 (two) times daily. 30 capsule 0  . etodolac (LODINE) 400 MG tablet Take 1 tablet (400 mg total) by mouth 2 (two) times daily. 60 tablet 3  . fluorouracil (EFUDEX) 5 % cream Apply topically once a week for 10 doses. Insert applicator 8/2-9/9 full of cream (1.5-2 grams) into vagina at night once a week for 10 weeks 40 g 0  . levothyroxine (SYNTHROID, LEVOTHROID) 175 MCG tablet Take 150 mcg by mouth daily before breakfast.     . Multiple Vitamins-Minerals (HM MULTIVITAMIN ADULT GUMMY) CHEW Chew 1 Dose by mouth every morning.    . ondansetron (ZOFRAN ODT) 8 MG disintegrating tablet Take 1 tablet (8 mg total) by mouth every 8 (eight) hours as needed for nausea or vomiting. 30 tablet 0  . oxyCODONE (ROXICODONE) 5 MG immediate release tablet Take 1-2 tablets (  5-10 mg total) by mouth every 4 (four) hours as needed for severe pain. 60 tablet 0  . PREMARIN vaginal cream INSERT 1/4 APPLICATORFUL VAGINALLY 2 TIMES A WEEK 30 g 2  . tiZANidine (ZANAFLEX) 2 MG tablet Take 2 mg by mouth at bedtime as needed for muscle spasms.     No current facility-administered medications for this visit.   Review of Systems General:  no complaints Skin: no complaints Eyes: no complaints HEENT: no complaints Breasts: no complaints Pulmonary: no complaints Cardiac: no complaints Gastrointestinal: no complaints Genitourinary/Sexual: no complaints Ob/Gyn: no complaints Musculoskeletal: no complaints Hematology: no complaints Neurologic/Psych: no complaints\   Objective:  Physical Examination:  BP 126/79   Pulse 63   Temp (!) 97.1 F (36.2 C) (Tympanic)   Resp 20   Ht 5\' 6"  (1.676 m)   Wt 128 lb (58.1 kg)   BMI 20.66 kg/m     ECOG Performance Status: 0 - Asymptomatic  GENERAL: Patient is a well appearing female in no acute distress  SKIN:  Clear with  no obvious rashes or skin changes.  NEURO:  Nonfocal. Well oriented.  Appropriate affect.  Pelvic: nursing chaperoned. Vulva: no lesions, redness, discharge. Vagina: slight erythema at the top of the vagina. No discharge, bleeding or ulcerations. Bimanual: not indicated.   Lab Review Labs on site today: none  Radiologic Imaging: None     Assessment:  Cathy Hunt is a 70 y.o. female diagnosed with HSIL PAP s/p TVH/BS for CIN1 in 2017. Prior CKC in the 1990s. Had low grade abnormal PAPs after hysterectomy LSIL/ASCUS, but now HSIL PAP 7/21.  Vaginal biopsies x 3 by Dr. Ouida Sills negative.  S/p vaginal ERT. 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 Biopsy consistent with HSIL. Currently s/p 2 weeks of vaginal efudex.   Non smoker and not immunosuppressed.      Plan:   Problem List Items Addressed This Visit      Other   Vaginal Pap smear with HGSIL - Primary     Tolerating Efudex well. Again reviewed application instruction including vaseline/desitin to nontreatment surfaces and vulva. Tampon after insertion of medication followed by tap water douche to remove medication from vagina following morning. Discussed that if she experiences application site pain, edema, burning, stinging, we may need to hold treatment for a week until symptoms resolve. Again reviewed recommendation to treat once a week for 10 weeks then 4 weeks after completion of treatment to have her return to clinic for reevaluation and repeat pap.   She will notify clinic if she has complications of treatment or if she needs to take treatment breaks.   All questions were answered to their satisfaction.  Beckey Rutter, DNP, AGNP-C [place] at [facility] [phone] (clinic)

## 2020-08-09 DIAGNOSIS — E039 Hypothyroidism, unspecified: Secondary | ICD-10-CM | POA: Diagnosis not present

## 2020-08-18 ENCOUNTER — Encounter (INDEPENDENT_AMBULATORY_CARE_PROVIDER_SITE_OTHER): Payer: Self-pay | Admitting: Vascular Surgery

## 2020-08-18 ENCOUNTER — Other Ambulatory Visit: Payer: Self-pay

## 2020-08-18 ENCOUNTER — Ambulatory Visit (INDEPENDENT_AMBULATORY_CARE_PROVIDER_SITE_OTHER): Payer: PPO | Admitting: Vascular Surgery

## 2020-08-18 DIAGNOSIS — I83813 Varicose veins of bilateral lower extremities with pain: Secondary | ICD-10-CM

## 2020-08-18 NOTE — Progress Notes (Signed)
Cathy Hunt is a 70 y.o.female who presents with painful varicose veins of the left leg  Past Medical History:  Diagnosis Date  . Arthritis   . Cancer ([place])    Cervical  . Complication of anesthesia    difficulty following verbal commands after vein ligation surgery  . Dental crowns present    "a lot"  . Headache    migraines  . Hypothyroidism   . Insomnia   . PONV (postoperative nausea and vomiting)     Past Surgical History:  Procedure Laterality Date  . APPENDECTOMY    . BILATERAL SALPINGECTOMY  08/21/2015   Procedure: BILATERAL SALPINGECTOMY;  Surgeon: Boykin Nearing, MD;  Location: [facility];  Service: Gynecology;;  . BREAST EXCISIONAL BIOPSY Right 2008   neg  . BREAST SURGERY Right    Lumpectomy  . CERVICAL CONE BIOPSY  1984  . COLONOSCOPY WITH PROPOFOL N/A 03/23/2015   Procedure: COLONOSCOPY WITH PROPOFOL;  Surgeon: Hulen Luster, MD;  Location: [facility];  Service: Gastroenterology;  Laterality: N/A;  . DIAGNOSTIC LAPAROSCOPY    . SHOULDER ARTHROSCOPY Left 12/13/2015   Procedure: arthroscopic debridement, arthroscopic subacromial decompression, and mini-open rotator cuff repair, left shoulder;  Surgeon: Corky Mull, MD;  Location: [place];  Service: Orthopedics;  Laterality: Left;  Patien requests to be first  . TONSILLECTOMY    . TUBAL LIGATION    . VAGINAL HYSTERECTOMY N/A 08/21/2015   Procedure: HYSTERECTOMY VAGINAL;  Surgeon: Boykin Nearing, MD;  Location: [facility];  Service: Gynecology;  Laterality: N/A;  . VAGINAL HYSTERECTOMY    . VEIN LIGATION AND STRIPPING  1985    Current Outpatient Medications  Medication Sig Dispense Refill  . aspirin EC 81 MG tablet Take 81 mg by mouth daily.    Marland Kitchen aspirin-acetaminophen-caffeine (EXCEDRIN MIGRAINE) 250-250-65 MG tablet Take 2 tablets by mouth every 6 (six) hours as needed for headache.    . Calcium Carb-Cholecalciferol 1000-800 MG-UNIT TABS Take 2 capsules by mouth daily.    Marland Kitchen docusate  sodium (COLACE) 100 MG capsule Take 1 capsule (100 mg total) by mouth 2 (two) times daily. 30 capsule 0  . etodolac (LODINE) 400 MG tablet Take 1 tablet (400 mg total) by mouth 2 (two) times daily. 60 tablet 3  . levothyroxine (SYNTHROID, LEVOTHROID) 175 MCG tablet Take 150 mcg by mouth daily before breakfast.     . Multiple Vitamins-Minerals (HM MULTIVITAMIN ADULT GUMMY) CHEW Chew 1 Dose by mouth every morning.    . ondansetron (ZOFRAN ODT) 8 MG disintegrating tablet Take 1 tablet (8 mg total) by mouth every 8 (eight) hours as needed for nausea or vomiting. 30 tablet 0  . oxyCODONE (ROXICODONE) 5 MG immediate release tablet Take 1-2 tablets (5-10 mg total) by mouth every 4 (four) hours as needed for severe pain. 60 tablet 0  . PREMARIN vaginal cream INSERT 1/4 APPLICATORFUL VAGINALLY 2 TIMES A WEEK 30 g 2  . tiZANidine (ZANAFLEX) 2 MG tablet Take 2 mg by mouth at bedtime as needed for muscle spasms.    . ALPRAZolam (XANAX) 0.5 MG tablet Take by mouth.    . levothyroxine (SYNTHROID) 112 MCG tablet Take 112 mcg by mouth daily.     No current facility-administered medications for this visit.    Allergies  Allergen Reactions  . Codeine Other (See Comments)    "drop in blood pressure"  . Penicillins Swelling  . Phenergan [Promethazine Hcl] Other (See Comments)    "drop in blood pressure"  .  Promethazine Other (See Comments)    Low BP  . Adhesive [Tape] Rash  . Latex Rash    Indication: Patient presents with symptomatic varicose veins of the left lower extremity.  Procedure: Originally, the patient was prepared for laser ablation of the left small saphenous vein.  On interrogation with ultrasound today, the segments of small saphenous vein residual after previous stripping were far too short and small for laser ablation.  The prominent, painful varicosities were far too tortuous and superficial to allow the wire for laser ablation.  I elected to perform foam sclerotherapy on the left small  saphenous vein residual segments as well as the varicosities that are painful.  Foam sclerotherapy was performed on the left lower extremity. Using ultrasound guidance, 5 mL of foam Sotradecol was used to inject the varicosities of the left lower extremity. Compression wraps were placed. The patient tolerated the procedure well.  She has prominent varicosities residual on both the left and the right that would benefit from foam sclerotherapy without long enough segments to perform laser ablation on any residual saphenous vein segments on either side.

## 2020-08-25 ENCOUNTER — Encounter (INDEPENDENT_AMBULATORY_CARE_PROVIDER_SITE_OTHER): Payer: PPO

## 2020-09-15 ENCOUNTER — Encounter (INDEPENDENT_AMBULATORY_CARE_PROVIDER_SITE_OTHER): Payer: Self-pay | Admitting: Vascular Surgery

## 2020-09-15 ENCOUNTER — Other Ambulatory Visit: Payer: Self-pay

## 2020-09-15 ENCOUNTER — Ambulatory Visit (INDEPENDENT_AMBULATORY_CARE_PROVIDER_SITE_OTHER): Payer: PPO | Admitting: Vascular Surgery

## 2020-09-15 VITALS — BP 123/73 | HR 68 | Resp 16 | Wt 127.6 lb

## 2020-09-15 DIAGNOSIS — I83813 Varicose veins of bilateral lower extremities with pain: Secondary | ICD-10-CM

## 2020-09-15 NOTE — Assessment & Plan Note (Signed)
The patient is responding appropriately to foam sclerotherapy thus far.  There is still prominent residual varicosities that need treatment and she is scheduled for another foam sclerotherapy treatment next week.  She is going to need several more to eliminate her painful varicosities.

## 2020-09-15 NOTE — Progress Notes (Signed)
MRN : [id number]  Cathy Hunt is a 70 y.o. (1950/12/09) female who presents with chief complaint of  Chief Complaint  Patient presents with   Follow-up    4 wk post sclero   .  History of Present Illness: Patient returns today in follow up of her venous disease.  About a month ago, she underwent foam sclerotherapy of a large incompetent varicose vein in the posterior left calf.  She was originally slated for laser ablation, but this was far too tortuous and superficial for laser.  This vein is much better but she still does have prominent varicosities present in the left lower extremity.  These are still painful and bothersome on a daily basis.  Current Outpatient Medications  Medication Sig Dispense Refill   ALPRAZolam (XANAX) 0.5 MG tablet Take by mouth.     aspirin EC 81 MG tablet Take 81 mg by mouth daily.     aspirin-acetaminophen-caffeine (EXCEDRIN MIGRAINE) 250-250-65 MG tablet Take 2 tablets by mouth every 6 (six) hours as needed for headache.     Calcium Carb-Cholecalciferol 1000-800 MG-UNIT TABS Take 2 capsules by mouth daily.     docusate sodium (COLACE) 100 MG capsule Take 1 capsule (100 mg total) by mouth 2 (two) times daily. 30 capsule 0   etodolac (LODINE) 400 MG tablet Take 1 tablet (400 mg total) by mouth 2 (two) times daily. 60 tablet 3   levothyroxine (SYNTHROID) 112 MCG tablet Take 112 mcg by mouth daily.     levothyroxine (SYNTHROID, LEVOTHROID) 175 MCG tablet Take 150 mcg by mouth daily before breakfast.      Multiple Vitamins-Minerals (HM MULTIVITAMIN ADULT GUMMY) CHEW Chew 1 Dose by mouth every morning.     ondansetron (ZOFRAN ODT) 8 MG disintegrating tablet Take 1 tablet (8 mg total) by mouth every 8 (eight) hours as needed for nausea or vomiting. 30 tablet 0   oxyCODONE (ROXICODONE) 5 MG immediate release tablet Take 1-2 tablets (5-10 mg total) by mouth every 4 (four) hours as needed for severe pain. 60 tablet 0   PREMARIN vaginal cream INSERT 1/4 APPLICATORFUL  VAGINALLY 2 TIMES A WEEK 30 g 2   tiZANidine (ZANAFLEX) 2 MG tablet Take 2 mg by mouth at bedtime as needed for muscle spasms.     No current facility-administered medications for this visit.    Past Medical History:  Diagnosis Date   Arthritis    Cancer ([place])    Cervical   Complication of anesthesia    difficulty following verbal commands after vein ligation surgery   Dental crowns present    "a lot"   Headache    migraines   Hypothyroidism    Insomnia    PONV (postoperative nausea and vomiting)     Past Surgical History:  Procedure Laterality Date   APPENDECTOMY     BILATERAL SALPINGECTOMY  08/21/2015   Procedure: BILATERAL SALPINGECTOMY;  Surgeon: Gwen Her Schermerhorn, MD;  Location: [facility];  Service: Gynecology;;   BREAST EXCISIONAL BIOPSY Right 2008   neg   BREAST SURGERY Right    Lumpectomy   CERVICAL CONE BIOPSY  1984   COLONOSCOPY WITH PROPOFOL N/A 03/23/2015   Procedure: COLONOSCOPY WITH PROPOFOL;  Surgeon: Hulen Luster, MD;  Location: [facility] END[facility]COPY;  Service: Gastroenterology;  Laterality: N/A;   DIAGNOSTIC LAPAROSCOPY     SHOULDER ARTHROSCOPY Left 12/13/2015   Procedure: arthroscopic debridement, arthroscopic subacromial decompression, and mini-open rotator cuff repair, left shoulder;  Surgeon: Corky Mull, MD;  [facility];  Service: Orthopedics;  Laterality: Left;  Patien requests to be first   TONSILLECTOMY     TUBAL LIGATION     VAGINAL HYSTERECTOMY N/A 08/21/2015   Procedure: HYSTERECTOMY VAGINAL;  Surgeon: Boykin Nearing, MD;  Location: [facility];  Service: Gynecology;  Laterality: N/A;   VAGINAL HYSTERECTOMY     VEIN LIGATION AND STRIPPING  1985     Social History   Tobacco Use   Smoking status: Never   Smokeless tobacco: Never  Substance Use Topics   Alcohol use: Yes    Comment: social   Drug use: No       Family History  Problem Relation Age of Onset   Cervical cancer Mother    Heart attack Father    Cervical  cancer Sister    Breast cancer Neg Hx      Allergies  Allergen Reactions   Codeine Other (See Comments)    "drop in blood pressure"   Penicillins Swelling   Phenergan [Promethazine Hcl] Other (See Comments)    "drop in blood pressure"   Promethazine Other (See Comments)    Low BP   Adhesive [Tape] Rash   Latex Rash     REVIEW OF SYSTEMS (Negative unless checked)  Constitutional: [] Weight loss  [] Fever  [] Chills Cardiac: [] Chest pain   [] Chest pressure   [] Palpitations   [] Shortness of breath when laying flat   [] Shortness of breath at rest   [] Shortness of breath with exertion. Vascular:  [] Pain in legs with walking   [] Pain in legs at rest   [] Pain in legs when laying flat   [] Claudication   [] Pain in feet when walking  [] Pain in feet at rest  [] Pain in feet when laying flat   [] History of DVT   [] Phlebitis   [x] Swelling in legs   [x] Varicose veins   [] Non-healing ulcers Pulmonary:   [] Uses home oxygen   [] Productive cough   [] Hemoptysis   [] Wheeze  [] COPD   [] Asthma Neurologic:  [] Dizziness  [] Blackouts   [] Seizures   [] History of stroke   [] History of TIA  [] Aphasia   [] Temporary blindness   [] Dysphagia   [] Weakness or numbness in arms   [] Weakness or numbness in legs Musculoskeletal:  [] Arthritis   [] Joint swelling   [] Joint pain   [] Low back pain Hematologic:  [] Easy bruising  [] Easy bleeding   [] Hypercoagulable state   [] Anemic   Gastrointestinal:  [] Blood in stool   [] Vomiting blood  [] Gastroesophageal reflux/heartburn   [] Abdominal pain Genitourinary:  [] Chronic kidney disease   [] Difficult urination  [] Frequent urination  [] Burning with urination   [] Hematuria Skin:  [] Rashes   [] Ulcers   [] Wounds Psychological:  [] History of anxiety   []  History of major depression.  Physical Examination  BP 123/73 (BP Location: Left Arm)   Pulse 68   Resp 16   Wt 127 lb 9.6 oz (57.9 kg)   BMI 20.60 kg/m  Gen:  WD/WN, NAD Head: [place]/AT, No temporalis wasting. Ear/Nose/Throat:  Hearing grossly intact, nares w/o erythema or drainage Eyes: Conjunctiva clear. Sclera non-icteric Neck: Supple.  Trachea midline Pulmonary:  Good air movement, no use of accessory muscles.  Cardiac: RRR, no JVD Vascular:  Vessel Right Left  Radial Palpable Palpable                   Musculoskeletal: M/S 5/5 throughout.  No deformity or atrophy. Diffuse varicosities on the left leg. No edema. Neurologic: Sensation grossly intact in extremities.  Symmetrical.  Speech is fluent.  Psychiatric: Judgment intact, Mood & affect appropriate for pt's clinical situation. Dermatologic: No rashes or ulcers noted.  No cellulitis or open wounds.      Labs No results found for this or any previous visit (from the past 2160 hour(s)).  Radiology No results found.  Assessment/Plan  Varicose veins of leg with pain, bilateral The patient is responding appropriately to foam sclerotherapy thus far.  There is still prominent residual varicosities that need treatment and she is scheduled for another foam sclerotherapy treatment next week.  She is going to need several more to eliminate her painful varicosities.    Leotis Pain, MD  09/15/2020 9:59 AM    This note was created with Dragon medical transcription system.  Any errors from dictation are purely unintentional

## 2020-09-19 ENCOUNTER — Other Ambulatory Visit: Payer: Self-pay

## 2020-09-19 ENCOUNTER — Ambulatory Visit (INDEPENDENT_AMBULATORY_CARE_PROVIDER_SITE_OTHER): Payer: PPO | Admitting: Vascular Surgery

## 2020-09-19 ENCOUNTER — Encounter (INDEPENDENT_AMBULATORY_CARE_PROVIDER_SITE_OTHER): Payer: Self-pay | Admitting: Vascular Surgery

## 2020-09-19 VITALS — BP 115/73 | HR 64 | Resp 16 | Wt 127.0 lb

## 2020-09-19 DIAGNOSIS — I83813 Varicose veins of bilateral lower extremities with pain: Secondary | ICD-10-CM

## 2020-09-19 NOTE — Progress Notes (Signed)
Cathy Hunt is a 70 y.o.female who presents with painful varicose veins of the left leg  Past Medical History:  Diagnosis Date   Arthritis    Cancer ([place])    Cervical   Complication of anesthesia    difficulty following verbal commands after vein ligation surgery   Dental crowns present    "a lot"   Headache    migraines   Hypothyroidism    Insomnia    PONV (postoperative nausea and vomiting)     Past Surgical History:  Procedure Laterality Date   APPENDECTOMY     BILATERAL SALPINGECTOMY  08/21/2015   Procedure: BILATERAL SALPINGECTOMY;  Surgeon: Gwen Her Schermerhorn, MD;  Location: [facility];  Service: Gynecology;;   BREAST EXCISIONAL BIOPSY Right 2008   neg   BREAST SURGERY Right    Lumpectomy   CERVICAL CONE BIOPSY  1984   COLONOSCOPY WITH PROPOFOL N/A 03/23/2015   Procedure: COLONOSCOPY WITH PROPOFOL;  Surgeon: Hulen Luster, MD;  [facility] END[facility];  Service: Gastroenterology;  Laterality: N/A;   DIAGNOSTIC LAPAROSCOPY     SHOULDER ARTHROSCOPY Left 12/13/2015   Procedure: arthroscopic debridement, arthroscopic subacromial decompression, and mini-open rotator cuff repair, left shoulder;  Surgeon: Corky Mull, MD;  Location: [place];  Service: Orthopedics;  Laterality: Left;  Patien requests to be first   TONSILLECTOMY     TUBAL LIGATION     VAGINAL HYSTERECTOMY N/A 08/21/2015   Procedure: HYSTERECTOMY VAGINAL;  Surgeon: Boykin Nearing, MD;  Location: [facility];  Service: Gynecology;  Laterality: N/A;   VAGINAL HYSTERECTOMY     VEIN LIGATION AND STRIPPING  1985    Current Outpatient Medications  Medication Sig Dispense Refill   ALPRAZolam (XANAX) 0.5 MG tablet Take by mouth.     aspirin EC 81 MG tablet Take 81 mg by mouth daily.     aspirin-acetaminophen-caffeine (EXCEDRIN MIGRAINE) 250-250-65 MG tablet Take 2 tablets by mouth every 6 (six) hours as needed for headache.     Calcium Carb-Cholecalciferol 1000-800 MG-UNIT TABS Take 2 capsules by  mouth daily.     docusate sodium (COLACE) 100 MG capsule Take 1 capsule (100 mg total) by mouth 2 (two) times daily. 30 capsule 0   etodolac (LODINE) 400 MG tablet Take 1 tablet (400 mg total) by mouth 2 (two) times daily. 60 tablet 3   levothyroxine (SYNTHROID) 112 MCG tablet Take 112 mcg by mouth daily.     levothyroxine (SYNTHROID, LEVOTHROID) 175 MCG tablet Take 150 mcg by mouth daily before breakfast.      Multiple Vitamins-Minerals (HM MULTIVITAMIN ADULT GUMMY) CHEW Chew 1 Dose by mouth every morning.     ondansetron (ZOFRAN ODT) 8 MG disintegrating tablet Take 1 tablet (8 mg total) by mouth every 8 (eight) hours as needed for nausea or vomiting. 30 tablet 0   oxyCODONE (ROXICODONE) 5 MG immediate release tablet Take 1-2 tablets (5-10 mg total) by mouth every 4 (four) hours as needed for severe pain. 60 tablet 0   PREMARIN vaginal cream INSERT 1/4 APPLICATORFUL VAGINALLY 2 TIMES A WEEK 30 g 2   tiZANidine (ZANAFLEX) 2 MG tablet Take 2 mg by mouth at bedtime as needed for muscle spasms.     No current facility-administered medications for this visit.    Allergies  Allergen Reactions   Codeine Other (See Comments)    "drop in blood pressure"   Penicillins Swelling   Phenergan [Promethazine Hcl] Other (See Comments)    "drop in blood pressure"  Promethazine Other (See Comments)    Low BP   Adhesive [Tape] Rash   Latex Rash    Indication: Patient presents with symptomatic varicose veins of the left lower extremity.  Procedure: Foam sclerotherapy was performed on the left lower extremity. Using ultrasound guidance, 5 mL of foam Sotradecol was used to inject the varicosities of the left lower extremity. Compression wraps were placed. The patient tolerated the procedure well.

## 2020-09-20 ENCOUNTER — Inpatient Hospital Stay: Payer: PPO | Attending: Obstetrics and Gynecology | Admitting: Obstetrics and Gynecology

## 2020-09-20 VITALS — BP 114/75 | HR 72 | Temp 97.8°F | Resp 20 | Wt 126.0 lb

## 2020-09-20 DIAGNOSIS — E039 Hypothyroidism, unspecified: Secondary | ICD-10-CM | POA: Diagnosis not present

## 2020-09-20 DIAGNOSIS — Z7982 Long term (current) use of aspirin: Secondary | ICD-10-CM | POA: Insufficient documentation

## 2020-09-20 DIAGNOSIS — Z79890 Hormone replacement therapy (postmenopausal): Secondary | ICD-10-CM | POA: Insufficient documentation

## 2020-09-20 DIAGNOSIS — M810 Age-related osteoporosis without current pathological fracture: Secondary | ICD-10-CM | POA: Insufficient documentation

## 2020-09-20 DIAGNOSIS — R87623 High grade squamous intraepithelial lesion on cytologic smear of vagina (HGSIL): Secondary | ICD-10-CM | POA: Diagnosis not present

## 2020-09-20 DIAGNOSIS — Z90710 Acquired absence of both cervix and uterus: Secondary | ICD-10-CM | POA: Insufficient documentation

## 2020-09-20 DIAGNOSIS — M79604 Pain in right leg: Secondary | ICD-10-CM | POA: Insufficient documentation

## 2020-09-20 DIAGNOSIS — Z79899 Other long term (current) drug therapy: Secondary | ICD-10-CM | POA: Insufficient documentation

## 2020-09-20 DIAGNOSIS — M79605 Pain in left leg: Secondary | ICD-10-CM | POA: Insufficient documentation

## 2020-09-20 NOTE — Progress Notes (Signed)
Gynecologic Oncology Consult Visit   Referring Provider: Dr. Ouida Sills   Chief Complaint: Vaginal dysplasia  Subjective:  Cathy Hunt is a 70 y.o. G2P1 female who is seen in consultation from Dr. Ouida Sills for HSIL on pap s/p [facility] BS 5/17.   Colposcopy on 01/12/20 was negative. She was started on vaginal premarin and re-presented to clinic 05/17/20 for Pap and repeat colposcopy.  PAP LSIL. Colposcopic biopsy A. VAGINA, RIGHT FORNIX; BIOPSY:  - HIGH-GRADE SQUAMOUS INTRAEPITHELIAL LESION (VAIN 2).  Finished 10 weeks of Efudex 5 weeks ago.  Tolerated well with some irritation. No complaints today.  Had varicose vein treatment in left lower leg recently.   Gynecologic History:  Cathy Hunt is a pleasant G2P1 female who is seen in consultation from Dr. Ouida Sills for HSIL on pap s/p [facility] BS 5/17.   CKC in 90s.   2017- ASCUS + HR HPV w/ repeat pap 6 months later with same result.   Repeat colposcopy in 07/2015 showed LGSIL in the ECC. Definitive procedure was recommended and she underwent TVH and BS with Dr. Ouida Sills on 08/21/2015 for CIN1 with positive ECC.   09/24/16- ASCUS  09/30/17- LSIL  10/14/18- LSIL  10/20/19- HSIL  Specimen A-Vaginal Biopsy, vaginal cuff 2 o'clock: BENIGN ATROPHIC SQUAMOUS MUCOSA. NEGATIVE FOR DYSPLASIA AND MALIGNANCY.  Specimen B-Vaginal Biopsy, vaginal cuff 3 o'clock: BENIGN ATROPHIC SQUAMOUS MUCOSA. NEGATIVE FOR DYSPLASIA AND MALIGNANCY.  Specimen C-Vaginal Biopsy, vaginal cuff 4 o'clock: TISSUE DID NOT SURVIVE PROCESSING.  She was seen by Dr. Fransisca Connors and he recommended repeat Pap with colposcopy in 4 months.   Colposcopy on 01/12/20 was negative. She was started on vaginal premarin and re-presented to clinic 05/17/20 for Pap and repeat colposcopy.  PAP LSIL. Colposcopic biopsy A. VAGINA, RIGHT FORNIX; BIOPSY:  - HIGH-GRADE SQUAMOUS INTRAEPITHELIAL LESION (VAIN 2).  Problem List: Patient Active Problem List   Diagnosis Date Noted   Varicose  veins of leg with pain, bilateral 08/18/2020   Vaginal Pap smear with HGSIL 01/17/2020   Hypothyroidism, adult 04/14/2019   Postmenopausal osteoporosis 01/16/2016   Complete tear of left rotator cuff 11/17/2015   Rotator cuff tendinitis, left 11/17/2015   Postoperative state 08/21/2015    Past Medical History: Past Medical History:  Diagnosis Date   Arthritis    Cancer ([place])    Cervical   Complication of anesthesia    difficulty following verbal commands after vein ligation surgery   Dental crowns present    "a lot"   Headache    migraines   Hypothyroidism    Insomnia    PONV (postoperative nausea and vomiting)     Past Surgical History: Past Surgical History:  Procedure Laterality Date   APPENDECTOMY     BILATERAL SALPINGECTOMY  08/21/2015   Procedure: BILATERAL SALPINGECTOMY;  Surgeon: Gwen Her Schermerhorn, MD;  Location: [facility] ORS;  Service: Gynecology;;   BREAST EXCISIONAL BIOPSY Right 2008   neg   BREAST SURGERY Right    Lumpectomy   CERVICAL CONE BIOPSY  1984   COLONOSCOPY WITH PROPOFOL N/A 03/23/2015   Procedure: COLONOSCOPY WITH PROPOFOL;  Surgeon: Hulen Luster, MD;  Location: [facility] ENDOSCOPY;  Service: Gastroenterology;  Laterality: N/A;   DIAGNOSTIC LAPAROSCOPY     SHOULDER ARTHROSCOPY Left 12/13/2015   Procedure: arthroscopic debridement, arthroscopic subacromial decompression, and mini-open rotator cuff repair, left shoulder;  Surgeon: Corky Mull, MD;  Location: [place];  Service: Orthopedics;  Laterality: Left;  Patien requests to be first   TONSILLECTOMY     TUBAL  LIGATION     VAGINAL HYSTERECTOMY N/A 08/21/2015   Procedure: HYSTERECTOMY VAGINAL;  Surgeon: Boykin Nearing, MD;  Location: [facility] ORS;  Service: Gynecology;  Laterality: N/A;   VAGINAL HYSTERECTOMY     VEIN LIGATION AND STRIPPING  1985   Past Gynecologic History:  Post menopausal.  Menarche: age 27 No pmb or hot flashes Not sexually active.  Hx of abnormal pap smears Hx of  cone biopsy, tubal ligation, hysterectomy  OB History: . G2P1 OB History  No obstetric history on file.   Family History: Family History  Problem Relation Age of Onset   Cervical cancer Mother    Heart attack Father    Cervical cancer Sister    Breast cancer Neg Hx    Social History: Social History   Socioeconomic History   Marital status: Married    Spouse name: Not on file   Number of children: Not on file   Years of education: Not on file   Highest education level: Not on file  Occupational History   Not on file  Tobacco Use   Smoking status: Never   Smokeless tobacco: Never  Substance and Sexual Activity   Alcohol use: Yes    Comment: social   Drug use: No   Sexual activity: Not on file  Other Topics Concern   Not on file  Social History Narrative   Not on file   Social Determinants of Health   Financial Resource Strain: Not on file  Food Insecurity: Not on file  Transportation Needs: Not on file  Physical Activity: Not on file  Stress: Not on file  Social Connections: Not on file  Intimate Partner Violence: Not on file   Immunization History  Administered Date(s) Administered   Influenza Inj Mdck Quad Pf 01/05/2016   Influenza Split 01/05/2015   Influenza-Unspecified 02/05/2017, 01/06/2018   PFIZER(Purple Top)SARS-COV-2 Vaccination 05/23/2019, 06/22/2019   Pneumococcal Conjugate-13 02/08/2016   Pneumococcal Polysaccharide-23 01/05/2015   Allergies: Allergies  Allergen Reactions   Codeine Other (See Comments)    "drop in blood pressure"   Penicillins Swelling   Phenergan [Promethazine Hcl] Other (See Comments)    "drop in blood pressure"   Promethazine Other (See Comments)    Low BP   Adhesive [Tape] Rash   Latex Rash   Current Medications: Current Outpatient Medications  Medication Sig Dispense Refill   ALPRAZolam (XANAX) 0.5 MG tablet Take by mouth.     aspirin EC 81 MG tablet Take 81 mg by mouth daily.      aspirin-acetaminophen-caffeine (EXCEDRIN MIGRAINE) 250-250-65 MG tablet Take 2 tablets by mouth every 6 (six) hours as needed for headache.     Calcium Carb-Cholecalciferol 1000-800 MG-UNIT TABS Take 2 capsules by mouth daily.     docusate sodium (COLACE) 100 MG capsule Take 1 capsule (100 mg total) by mouth 2 (two) times daily. 30 capsule 0   etodolac (LODINE) 400 MG tablet Take 1 tablet (400 mg total) by mouth 2 (two) times daily. 60 tablet 3   levothyroxine (SYNTHROID) 112 MCG tablet Take 112 mcg by mouth daily.     levothyroxine (SYNTHROID, LEVOTHROID) 175 MCG tablet Take 150 mcg by mouth daily before breakfast.      Multiple Vitamins-Minerals (HM MULTIVITAMIN ADULT GUMMY) CHEW Chew 1 Dose by mouth every morning.     ondansetron (ZOFRAN ODT) 8 MG disintegrating tablet Take 1 tablet (8 mg total) by mouth every 8 (eight) hours as needed for nausea or vomiting. 30 tablet 0  oxyCODONE (ROXICODONE) 5 MG immediate release tablet Take 1-2 tablets (5-10 mg total) by mouth every 4 (four) hours as needed for severe pain. 60 tablet 0   PREMARIN vaginal cream INSERT 1/4 APPLICATORFUL VAGINALLY 2 TIMES A WEEK 30 g 2   tiZANidine (ZANAFLEX) 2 MG tablet Take 2 mg by mouth at bedtime as needed for muscle spasms.     No current facility-administered medications for this visit.   Review of Systems General:  no complaints Skin: no complaints Eyes: no complaints HEENT: no complaints Breasts: no complaints Pulmonary: no complaints Cardiac: no complaints Gastrointestinal: no complaints Genitourinary/Sexual: no complaints Ob/Gyn: no complaints Musculoskeletal: no complaints Hematology: no complaints Neurologic/Psych: no complaints  Objective:  Physical Examination:  BP 114/75   Pulse 72   Temp 97.8 F (36.6 C)   Resp 20   Wt 126 lb (57.2 kg)   SpO2 100%   BMI 20.34 kg/m     ECOG Performance Status: 0 - Asymptomatic  GENERAL: Patient is a well appearing female in no acute  distress EXTREMITIES:  No peripheral edema.  Bruising on inner part of left lower leg s/p varicose vein treatment recently. SKIN:  Clear with no obvious rashes or skin changes.  NEURO:  Nonfocal. Well oriented.  Appropriate affect.  Pelvic: chaperoned by nursing.  EGBUS: no lesions Cervix: surgically absent Vagina:  atrophy, no lesions, no discharge or bleeding. Pap obtained Uterus: surgically absent BME: no palpable masses Rectovaginal:  deferred  Lab Review Labs on site today: none  Radiologic Imaging: None    Assessment:  Cathy Hunt is a 70 y.o. female diagnosed with HSIL PAP s/p TVH/BS for CIN1 in 2017. Prior CKC in the 1990s. Had low grade abnormal PAPs after hysterectomy LSIL/ASCUS, but now HSIL PAP 7/21.  Vaginal biopsies x 3 by Dr. Ouida Sills negative.  S/p vaginal ERT.  Colposcopy c/w VAIN in the right upper vaginal fornix 2/22 and biopsy showed VAIN2. Treated with 10 weeks of Efudex.    No complaints today and presents for exam and PAP. Normal exam.  Non smoker and not immunosuppressed.      Plan:   Problem List Items Addressed This Visit       Other   Vaginal Pap smear with HGSIL - Primary   Relevant Orders   IGP, Aptima HPV   Follow up Pap results.  RTC in 6 months unless concerning symptoms in the interim.    She is not sexually active, but may want to pursue a relationship in the future.  Discussed significance of HPV and transmission to partners.  She could use barrier contraception, but this is not absolutely needed.  Also discussed her worries about progression of VAIN to cancer. Told her that this was highly unlikely if she maintains close surveillance with our clinic.     The patient's diagnosis, an outline of the further diagnostic and laboratory studies which will be required, the recommendation for surgery, and alternatives were discussed with her and her accompanying family members.  All questions were answered to their satisfaction.  Mellody Drown, MD

## 2020-09-25 LAB — IGP, APTIMA HPV: HPV Aptima: POSITIVE — AB

## 2020-10-17 ENCOUNTER — Ambulatory Visit (INDEPENDENT_AMBULATORY_CARE_PROVIDER_SITE_OTHER): Payer: PPO | Admitting: Vascular Surgery

## 2020-10-17 ENCOUNTER — Encounter (INDEPENDENT_AMBULATORY_CARE_PROVIDER_SITE_OTHER): Payer: Self-pay | Admitting: Vascular Surgery

## 2020-10-17 ENCOUNTER — Other Ambulatory Visit: Payer: Self-pay

## 2020-10-17 VITALS — BP 118/69 | HR 67 | Resp 16

## 2020-10-17 DIAGNOSIS — I83813 Varicose veins of bilateral lower extremities with pain: Secondary | ICD-10-CM | POA: Diagnosis not present

## 2020-10-17 NOTE — Progress Notes (Signed)
Cathy Hunt is a 70 y.o.female who presents with painful varicose veins of the left leg  Past Medical History:  Diagnosis Date   Arthritis    Cancer ([place])    Cervical   Complication of anesthesia    difficulty following verbal commands after vein ligation surgery   Dental crowns present    "a lot"   Headache    migraines   Hypothyroidism    Insomnia    PONV (postoperative nausea and vomiting)     Past Surgical History:  Procedure Laterality Date   APPENDECTOMY     BILATERAL SALPINGECTOMY  08/21/2015   Procedure: BILATERAL SALPINGECTOMY;  Surgeon: Gwen Her Schermerhorn, MD;  Location: [facility] ORS;  Service: Gynecology;;   BREAST EXCISIONAL BIOPSY Right 2008   neg   BREAST SURGERY Right    Lumpectomy   CERVICAL CONE BIOPSY  1984   COLONOSCOPY WITH PROPOFOL N/A 03/23/2015   Procedure: COLONOSCOPY WITH PROPOFOL;  Surgeon: Hulen Luster, MD;  Location: [facility] ENDOSCOPY;  Service: Gastroenterology;  Laterality: N/A;   DIAGNOSTIC LAPAROSCOPY     SHOULDER ARTHROSCOPY Left 12/13/2015   Procedure: arthroscopic debridement, arthroscopic subacromial decompression, and mini-open rotator cuff repair, left shoulder;  Surgeon: Corky Mull, MD;  Location: [place];  Service: Orthopedics;  Laterality: Left;  Patien requests to be first   TONSILLECTOMY     TUBAL LIGATION     VAGINAL HYSTERECTOMY N/A 08/21/2015   Procedure: HYSTERECTOMY VAGINAL;  Surgeon: Boykin Nearing, MD;  Location: [facility] ORS;  Service: Gynecology;  Laterality: N/A;   VAGINAL HYSTERECTOMY     VEIN LIGATION AND STRIPPING  1985    Current Outpatient Medications  Medication Sig Dispense Refill   aspirin EC 81 MG tablet Take 81 mg by mouth daily.     aspirin-acetaminophen-caffeine (EXCEDRIN MIGRAINE) 250-250-65 MG tablet Take 2 tablets by mouth every 6 (six) hours as needed for headache.     levothyroxine (SYNTHROID) 112 MCG tablet Take 112 mcg by mouth daily.     Multiple Vitamins-Minerals (HM MULTIVITAMIN ADULT  GUMMY) CHEW Chew 1 Dose by mouth every morning.     ALPRAZolam (XANAX) 0.5 MG tablet Take by mouth. (Patient not taking: No sig reported)     Calcium Carb-Cholecalciferol 1000-800 MG-UNIT TABS Take 2 capsules by mouth daily. (Patient not taking: No sig reported)     docusate sodium (COLACE) 100 MG capsule Take 1 capsule (100 mg total) by mouth 2 (two) times daily. (Patient not taking: No sig reported) 30 capsule 0   etodolac (LODINE) 400 MG tablet Take 1 tablet (400 mg total) by mouth 2 (two) times daily. (Patient not taking: No sig reported) 60 tablet 3   levothyroxine (SYNTHROID, LEVOTHROID) 175 MCG tablet Take 150 mcg by mouth daily before breakfast.  (Patient not taking: No sig reported)     ondansetron (ZOFRAN ODT) 8 MG disintegrating tablet Take 1 tablet (8 mg total) by mouth every 8 (eight) hours as needed for nausea or vomiting. (Patient not taking: No sig reported) 30 tablet 0   oxyCODONE (ROXICODONE) 5 MG immediate release tablet Take 1-2 tablets (5-10 mg total) by mouth every 4 (four) hours as needed for severe pain. (Patient not taking: No sig reported) 60 tablet 0   PREMARIN vaginal cream INSERT 1/4 APPLICATORFUL VAGINALLY 2 TIMES A WEEK (Patient not taking: No sig reported) 30 g 2   tiZANidine (ZANAFLEX) 2 MG tablet Take 2 mg by mouth at bedtime as needed for muscle spasms. (Patient not taking: No  sig reported)     No current facility-administered medications for this visit.    Allergies  Allergen Reactions   Codeine Other (See Comments)    "drop in blood pressure"   Penicillins Swelling   Phenergan [Promethazine Hcl] Other (See Comments)    "drop in blood pressure"   Promethazine Other (See Comments)    Low BP   Adhesive [Tape] Rash   Latex Rash    Indication: Patient presents with symptomatic varicose veins of the left lower extremity.  Procedure: Foam sclerotherapy was performed on the left lower extremity. Using ultrasound guidance, 5 mL of foam Sotradecol was used to  inject the varicosities of the left lower extremity. Compression wraps were placed. The patient tolerated the procedure well.   The patient does still have significant residual varicosities that would benefit from sclerotherapy and foam sclerotherapy, actually on both legs.  We will submit for this for approval and plan more treatments accordingly.

## 2020-10-23 DIAGNOSIS — E039 Hypothyroidism, unspecified: Secondary | ICD-10-CM | POA: Diagnosis not present

## 2020-10-30 DIAGNOSIS — Z0001 Encounter for general adult medical examination with abnormal findings: Secondary | ICD-10-CM | POA: Diagnosis not present

## 2020-10-30 DIAGNOSIS — Z0000 Encounter for general adult medical examination without abnormal findings: Secondary | ICD-10-CM | POA: Diagnosis not present

## 2020-10-30 DIAGNOSIS — Z1211 Encounter for screening for malignant neoplasm of colon: Secondary | ICD-10-CM | POA: Diagnosis not present

## 2020-10-30 DIAGNOSIS — M810 Age-related osteoporosis without current pathological fracture: Secondary | ICD-10-CM | POA: Diagnosis not present

## 2020-10-30 DIAGNOSIS — E039 Hypothyroidism, unspecified: Secondary | ICD-10-CM | POA: Diagnosis not present

## 2020-11-14 DIAGNOSIS — Z1211 Encounter for screening for malignant neoplasm of colon: Secondary | ICD-10-CM | POA: Diagnosis not present

## 2020-11-15 ENCOUNTER — Ambulatory Visit: Payer: PPO

## 2020-11-17 ENCOUNTER — Encounter (INDEPENDENT_AMBULATORY_CARE_PROVIDER_SITE_OTHER): Payer: Self-pay | Admitting: Vascular Surgery

## 2020-11-17 ENCOUNTER — Other Ambulatory Visit: Payer: Self-pay

## 2020-11-17 ENCOUNTER — Ambulatory Visit (INDEPENDENT_AMBULATORY_CARE_PROVIDER_SITE_OTHER): Payer: PPO | Admitting: Vascular Surgery

## 2020-11-17 VITALS — BP 132/76 | HR 67 | Resp 16 | Wt 128.0 lb

## 2020-11-17 DIAGNOSIS — I83813 Varicose veins of bilateral lower extremities with pain: Secondary | ICD-10-CM

## 2020-11-17 NOTE — Assessment & Plan Note (Signed)
The patient is improving after significant venous treatments most recently foam sclerotherapy.  She does still have residual varicosities on both lower extremities that are painful and cause her problems.  Further injection with foam sclerotherapy and potentially saline sclerotherapy would be of benefit for the patient.  Risks and benefits were discussed and she desires to proceed.

## 2020-11-17 NOTE — Progress Notes (Signed)
MRN : [id number]  Cathy Hunt is a 70 y.o. (01-27-1951) female who presents with chief complaint of  Chief Complaint  Patient presents with   Follow-up    4 wk follow up foam sclero appt  .  History of Present Illness: Patient returns today in follow up of her venous disease.  She has been undergoing treatment for her venous disease over the past several months.  Most recently, she completed course of foam sclerotherapy and laser ablation on the left leg.  This has resulted in significant improvement but not resolution of the painful varicosities on the left leg.  She also has painful varicosities on the right leg.  She still wears compression stockings.  Current Outpatient Medications  Medication Sig Dispense Refill   aspirin EC 81 MG tablet Take 81 mg by mouth daily.     aspirin-acetaminophen-caffeine (EXCEDRIN MIGRAINE) 250-250-65 MG tablet Take 2 tablets by mouth every 6 (six) hours as needed for headache.     levothyroxine (SYNTHROID) 112 MCG tablet Take 112 mcg by mouth daily.     Multiple Vitamins-Minerals (HM MULTIVITAMIN ADULT GUMMY) CHEW Chew 1 Dose by mouth every morning.     ondansetron (ZOFRAN ODT) 8 MG disintegrating tablet Take 1 tablet (8 mg total) by mouth every 8 (eight) hours as needed for nausea or vomiting. 30 tablet 0   oxyCODONE (ROXICODONE) 5 MG immediate release tablet Take 1-2 tablets (5-10 mg total) by mouth every 4 (four) hours as needed for severe pain. 60 tablet 0   ALPRAZolam (XANAX) 0.5 MG tablet Take by mouth. (Patient not taking: No sig reported)     Calcium Carb-Cholecalciferol 1000-800 MG-UNIT TABS Take 2 capsules by mouth daily. (Patient not taking: No sig reported)     docusate sodium (COLACE) 100 MG capsule Take 1 capsule (100 mg total) by mouth 2 (two) times daily. (Patient not taking: No sig reported) 30 capsule 0   etodolac (LODINE) 400 MG tablet Take 1 tablet (400 mg total) by mouth 2 (two) times daily. (Patient not taking: No sig reported) 60  tablet 3   levothyroxine (SYNTHROID, LEVOTHROID) 175 MCG tablet Take 150 mcg by mouth daily before breakfast.  (Patient not taking: No sig reported)     PREMARIN vaginal cream INSERT 1/4 APPLICATORFUL VAGINALLY 2 TIMES A WEEK (Patient not taking: No sig reported) 30 g 2   tiZANidine (ZANAFLEX) 2 MG tablet Take 2 mg by mouth at bedtime as needed for muscle spasms. (Patient not taking: No sig reported)     No current facility-administered medications for this visit.    Past Medical History:  Diagnosis Date   Arthritis    Cancer ([place])    Cervical   Complication of anesthesia    difficulty following verbal commands after vein ligation surgery   Dental crowns present    "a lot"   Headache    migraines   Hypothyroidism    Insomnia    PONV (postoperative nausea and vomiting)     Past Surgical History:  Procedure Laterality Date   APPENDECTOMY     BILATERAL SALPINGECTOMY  08/21/2015   Procedure: BILATERAL SALPINGECTOMY;  Surgeon: Gwen Her Schermerhorn, MD;  Location: [facility] ORS;  Service: Gynecology;;   BREAST EXCISIONAL BIOPSY Right 2008   neg   BREAST SURGERY Right    Lumpectomy   CERVICAL CONE BIOPSY  1984   COLONOSCOPY WITH PROPOFOL N/A 03/23/2015   Procedure: COLONOSCOPY WITH PROPOFOL;  Surgeon: Hulen Luster, MD;  Location: [facility] ENDOSCOPY;  Service: Gastroenterology;  Laterality: N/A;   DIAGNOSTIC LAPAROSCOPY     SHOULDER ARTHROSCOPY Left 12/13/2015   Procedure: arthroscopic debridement, arthroscopic subacromial decompression, and mini-open rotator cuff repair, left shoulder;  Surgeon: Corky Mull, MD;  Location: [place];  Service: Orthopedics;  Laterality: Left;  Patien requests to be first   TONSILLECTOMY     TUBAL LIGATION     VAGINAL HYSTERECTOMY N/A 08/21/2015   Procedure: HYSTERECTOMY VAGINAL;  Surgeon: Boykin Nearing, MD;  Location: [facility] ORS;  Service: Gynecology;  Laterality: N/A;   VAGINAL HYSTERECTOMY     VEIN LIGATION AND STRIPPING  1985     Social  History   Tobacco Use   Smoking status: Never   Smokeless tobacco: Never  Substance Use Topics   Alcohol use: Yes    Comment: social   Drug use: No       Family History  Problem Relation Age of Onset   Cervical cancer Mother    Heart attack Father    Cervical cancer Sister    Breast cancer Neg Hx      Allergies  Allergen Reactions   Codeine Other (See Comments)    "drop in blood pressure"   Penicillins Swelling   Phenergan [Promethazine Hcl] Other (See Comments)    "drop in blood pressure"   Promethazine Other (See Comments)    Low BP   Adhesive [Tape] Rash   Latex Rash     REVIEW OF SYSTEMS (Negative unless checked)  Constitutional: '[]'$ Weight loss  '[]'$ Fever  '[]'$ Chills Cardiac: '[]'$ Chest pain   '[]'$ Chest pressure   '[]'$ Palpitations   '[]'$ Shortness of breath when laying flat   '[]'$ Shortness of breath at rest   '[]'$ Shortness of breath with exertion. Vascular:  '[]'$ Pain in legs with walking   '[]'$ Pain in legs at rest   '[]'$ Pain in legs when laying flat   '[]'$ Claudication   '[]'$ Pain in feet when walking  '[]'$ Pain in feet at rest  '[]'$ Pain in feet when laying flat   '[]'$ History of DVT   '[]'$ Phlebitis   '[x]'$ Swelling in legs   '[x]'$ Varicose veins   '[]'$ Non-healing ulcers Pulmonary:   '[]'$ Uses home oxygen   '[]'$ Productive cough   '[]'$ Hemoptysis   '[]'$ Wheeze  '[]'$ COPD   '[]'$ Asthma Neurologic:  '[]'$ Dizziness  '[]'$ Blackouts   '[]'$ Seizures   '[]'$ History of stroke   '[]'$ History of TIA  '[]'$ Aphasia   '[]'$ Temporary blindness   '[]'$ Dysphagia   '[]'$ Weakness or numbness in arms   '[]'$ Weakness or numbness in legs Musculoskeletal:  '[x]'$ Arthritis   '[]'$ Joint swelling   '[]'$ Joint pain   '[]'$ Low back pain Hematologic:  '[]'$ Easy bruising  '[]'$ Easy bleeding   '[]'$ Hypercoagulable state   '[]'$ Anemic   Gastrointestinal:  '[]'$ Blood in stool   '[]'$ Vomiting blood  '[]'$ Gastroesophageal reflux/heartburn   '[]'$ Abdominal pain Genitourinary:  '[]'$ Chronic kidney disease   '[]'$ Difficult urination  '[]'$ Frequent urination  '[]'$ Burning with urination   '[]'$ Hematuria Skin:  '[]'$ Rashes   '[]'$ Ulcers    '[]'$ Wounds Psychological:  '[]'$ History of anxiety   '[]'$  History of major depression.  Physical Examination  BP 132/76 (BP Location: Right Arm)   Pulse 67   Resp 16   Wt 128 lb (58.1 kg)   BMI 20.66 kg/m  Gen:  WD/WN, NAD.  Appears younger than stated age Head: /AT, No temporalis wasting. Ear/Nose/Throat: Hearing grossly intact, nares w/o erythema or drainage Eyes: Conjunctiva clear. Sclera non-icteric Neck: Supple.  Trachea midline Pulmonary:  Good air movement, no use of accessory muscles.  Cardiac: RRR, no JVD Vascular: Diffuse varicosities present bilateral Vessel Right  Left  Radial Palpable Palpable                          PT Palpable Palpable  DP Palpable Palpable   Gastrointestinal: soft, non-tender/non-distended. No guarding/reflex.  Musculoskeletal: M/S 5/5 throughout.  No deformity or atrophy.  No significant lower extremity edema. Neurologic: Sensation grossly intact in extremities.  Symmetrical.  Speech is fluent.  Psychiatric: Judgment intact, Mood & affect appropriate for pt's clinical situation. Dermatologic: No rashes or ulcers noted.  No cellulitis or open wounds.      Labs Recent Results (from the past 2160 hour(s))  IGP, Aptima HPV     Status: Abnormal   Collection Time: 09/20/20  1:45 PM  Result Value Ref Range   Interpretation EPCA,ASUSB (A)     Comment: EPITHELIAL CELL ABNORMALITY. ATYPICAL SQUAMOUS CELLS OF UNDETERMINED SIGNIFICANCE (ASC-US).    Category ASC-US (A)     Comment: Atypical Squamous Cells of Undetermined Significance   Recommendation SFC (A)     Comment: Suggest follow up as clinically appropriate.   Adequacy SDER     Comment: Satisfactory for evaluation.   Clinician Provided ICD10 Comment     Comment: R87.623   Performed by: Comment     Comment: Wynona Meals, Cytotechnologist (ASCP)   Electronically signed by: Comment     Comment: Gretta Arab, MD, Pathologist   PATHOLOGIST PROVIDED ICD10: Comment     CommentGK:[id number]    Note: Comment     Comment: The Pap smear is a screening test designed to aid in the detection of premalignant and malignant conditions of the uterine cervix.  It is not a diagnostic procedure and should not be used as the sole means of detecting cervical cancer.  Both false-positive and false-negative reports do occur.    Test Methodology Comment     Comment: This liquid based ThinPrep(R) pap test was screened with the use of an image guided system.    HPV Aptima Positive (A) Negative    Comment: This nucleic acid amplification test detects fourteen high-risk HPV types (16,18,31,33,35,39,45,51,52,56,58,59,66,68) without differentiation.     Radiology No results found.  Assessment/Plan  Varicose veins of leg with pain, bilateral The patient is improving after significant venous treatments most recently foam sclerotherapy.  She does still have residual varicosities on both lower extremities that are painful and cause her problems.  Further injection with foam sclerotherapy and potentially saline sclerotherapy would be of benefit for the patient.  Risks and benefits were discussed and she desires to proceed.    Leotis Pain, MD  11/17/2020 9:11 AM    This note was created with Dragon medical transcription system.  Any errors from dictation are purely unintentional

## 2020-11-21 LAB — COLOGUARD: COLOGUARD: NEGATIVE

## 2020-12-12 ENCOUNTER — Telehealth (INDEPENDENT_AMBULATORY_CARE_PROVIDER_SITE_OTHER): Payer: Self-pay | Admitting: Vascular Surgery

## 2020-12-12 ENCOUNTER — Ambulatory Visit (INDEPENDENT_AMBULATORY_CARE_PROVIDER_SITE_OTHER): Payer: PPO | Admitting: Vascular Surgery

## 2020-12-12 ENCOUNTER — Other Ambulatory Visit: Payer: Self-pay

## 2020-12-12 VITALS — BP 118/72 | HR 60 | Resp 15

## 2020-12-12 DIAGNOSIS — I83813 Varicose veins of bilateral lower extremities with pain: Secondary | ICD-10-CM | POA: Diagnosis not present

## 2020-12-12 NOTE — Progress Notes (Signed)
Cathy Hunt is a 70 y.o.female who presents with painful varicose veins of the left leg  Past Medical History:  Diagnosis Date   Arthritis    Cancer ([place])    Cervical   Complication of anesthesia    difficulty following verbal commands after vein ligation surgery   Dental crowns present    "a lot"   Headache    migraines   Hypothyroidism    Insomnia    PONV (postoperative nausea and vomiting)     Past Surgical History:  Procedure Laterality Date   APPENDECTOMY     BILATERAL SALPINGECTOMY  08/21/2015   Procedure: BILATERAL SALPINGECTOMY;  Surgeon: Gwen Her Schermerhorn, MD;  Location: [facility] ORS;  Service: Gynecology;;   BREAST EXCISIONAL BIOPSY Right 2008   neg   BREAST SURGERY Right    Lumpectomy   CERVICAL CONE BIOPSY  1984   COLONOSCOPY WITH PROPOFOL N/A 03/23/2015   Procedure: COLONOSCOPY WITH PROPOFOL;  Surgeon: Hulen Luster, MD;  Location: [facility] ENDOSCOPY;  Service: Gastroenterology;  Laterality: N/A;   DIAGNOSTIC LAPAROSCOPY     SHOULDER ARTHROSCOPY Left 12/13/2015   Procedure: arthroscopic debridement, arthroscopic subacromial decompression, and mini-open rotator cuff repair, left shoulder;  Surgeon: Corky Mull, MD;  Location: [place];  Service: Orthopedics;  Laterality: Left;  Patien requests to be first   TONSILLECTOMY     TUBAL LIGATION     VAGINAL HYSTERECTOMY N/A 08/21/2015   Procedure: HYSTERECTOMY VAGINAL;  Surgeon: Boykin Nearing, MD;  Location: [facility] ORS;  Service: Gynecology;  Laterality: N/A;   VAGINAL HYSTERECTOMY     VEIN LIGATION AND STRIPPING  1985    Current Outpatient Medications  Medication Sig Dispense Refill   aspirin EC 81 MG tablet Take 81 mg by mouth daily.     aspirin-acetaminophen-caffeine (EXCEDRIN MIGRAINE) 250-250-65 MG tablet Take 2 tablets by mouth every 6 (six) hours as needed for headache.     Multiple Vitamins-Minerals (HM MULTIVITAMIN ADULT GUMMY) CHEW Chew 1 Dose by mouth every morning.     ondansetron (ZOFRAN ODT)  8 MG disintegrating tablet Take 1 tablet (8 mg total) by mouth every 8 (eight) hours as needed for nausea or vomiting. 30 tablet 0   oxyCODONE (ROXICODONE) 5 MG immediate release tablet Take 1-2 tablets (5-10 mg total) by mouth every 4 (four) hours as needed for severe pain. 60 tablet 0   ALPRAZolam (XANAX) 0.5 MG tablet Take by mouth. (Patient not taking: No sig reported)     Calcium Carb-Cholecalciferol 1000-800 MG-UNIT TABS Take 2 capsules by mouth daily. (Patient not taking: No sig reported)     docusate sodium (COLACE) 100 MG capsule Take 1 capsule (100 mg total) by mouth 2 (two) times daily. (Patient not taking: No sig reported) 30 capsule 0   etodolac (LODINE) 400 MG tablet Take 1 tablet (400 mg total) by mouth 2 (two) times daily. (Patient not taking: No sig reported) 60 tablet 3   levothyroxine (SYNTHROID) 112 MCG tablet Take 112 mcg by mouth daily. (Patient not taking: Reported on 12/12/2020)     levothyroxine (SYNTHROID, LEVOTHROID) 175 MCG tablet Take 150 mcg by mouth daily before breakfast.  (Patient not taking: No sig reported)     PREMARIN vaginal cream INSERT 1/4 APPLICATORFUL VAGINALLY 2 TIMES A WEEK (Patient not taking: No sig reported) 30 g 2   tiZANidine (ZANAFLEX) 2 MG tablet Take 2 mg by mouth at bedtime as needed for muscle spasms. (Patient not taking: No sig reported)  No current facility-administered medications for this visit.    Allergies  Allergen Reactions   Codeine Other (See Comments)    "drop in blood pressure"   Penicillins Swelling   Phenergan [Promethazine Hcl] Other (See Comments)    "drop in blood pressure"   Promethazine Other (See Comments)    Low BP   Adhesive [Tape] Rash   Latex Rash    Indication: Patient presents with symptomatic varicose veins of the left lower extremity.  Procedure: Foam sclerotherapy was performed on the left lower extremity. Using ultrasound guidance, 5 mL of foam Sotradecol was used to inject the varicosities of the left  lower extremity. Compression wraps were placed. The patient tolerated the procedure well.

## 2020-12-12 NOTE — Telephone Encounter (Signed)
LVM for patient to callback to be scheduled for saline sclerotherapy injections per visit 12/12/20 with JD (foam is not needed).

## 2021-01-09 ENCOUNTER — Ambulatory Visit (INDEPENDENT_AMBULATORY_CARE_PROVIDER_SITE_OTHER): Payer: PPO | Admitting: Vascular Surgery

## 2021-01-10 ENCOUNTER — Other Ambulatory Visit: Payer: Self-pay

## 2021-01-10 ENCOUNTER — Encounter (INDEPENDENT_AMBULATORY_CARE_PROVIDER_SITE_OTHER): Payer: Self-pay | Admitting: Vascular Surgery

## 2021-01-10 ENCOUNTER — Ambulatory Visit (INDEPENDENT_AMBULATORY_CARE_PROVIDER_SITE_OTHER): Payer: PPO | Admitting: Vascular Surgery

## 2021-01-10 VITALS — BP 135/71 | HR 65 | Resp 16 | Wt 126.6 lb

## 2021-01-10 DIAGNOSIS — I83813 Varicose veins of bilateral lower extremities with pain: Secondary | ICD-10-CM

## 2021-01-10 NOTE — Progress Notes (Signed)
Varicose veins of bilateral lower extremity with inflammation (454.1  I83.10) Current Plans   Indication: Patient presents with symptomatic varicose veins of the bilateral lower extremity.   Procedure: Sclerotherapy using hypertonic saline mixed with 1% Lidocaine was performed on the bilateral lower extremity. Compression wraps were placed. The patient tolerated the procedure well.   Note: The patient has undergone foam and one saline sclerotherapy to the bilateral legs.  On examination the patient has multiple superficial varicose veins which still remain on the legs.  These varicosities are painful and interfere with her ability to function on a daily basis.  The patient has been compliant in wearing her medical grade 1 compression socks, elevating her legs and remaining active.  She would benefit from additional saline sclerotherapy to the remaining varicosities.

## 2021-02-05 DIAGNOSIS — E039 Hypothyroidism, unspecified: Secondary | ICD-10-CM | POA: Diagnosis not present

## 2021-02-12 DIAGNOSIS — E039 Hypothyroidism, unspecified: Secondary | ICD-10-CM | POA: Diagnosis not present

## 2021-03-28 ENCOUNTER — Inpatient Hospital Stay: Payer: PPO

## 2021-04-23 DIAGNOSIS — E039 Hypothyroidism, unspecified: Secondary | ICD-10-CM | POA: Diagnosis not present

## 2021-04-24 ENCOUNTER — Telehealth: Payer: Self-pay | Admitting: Obstetrics and Gynecology

## 2021-04-24 NOTE — Telephone Encounter (Signed)
Pt called to reschedule her appt for 1-25. Call back at [phone]

## 2021-04-30 DIAGNOSIS — M5431 Sciatica, right side: Secondary | ICD-10-CM | POA: Diagnosis not present

## 2021-04-30 DIAGNOSIS — M810 Age-related osteoporosis without current pathological fracture: Secondary | ICD-10-CM | POA: Diagnosis not present

## 2021-04-30 DIAGNOSIS — E039 Hypothyroidism, unspecified: Secondary | ICD-10-CM | POA: Diagnosis not present

## 2021-05-02 ENCOUNTER — Ambulatory Visit: Payer: PPO

## 2021-05-09 ENCOUNTER — Inpatient Hospital Stay: Payer: PPO | Attending: Obstetrics and Gynecology | Admitting: Obstetrics and Gynecology

## 2021-05-09 ENCOUNTER — Other Ambulatory Visit: Payer: Self-pay

## 2021-05-09 VITALS — BP 113/74 | HR 74 | Temp 98.7°F | Resp 20 | Wt 130.1 lb

## 2021-05-09 DIAGNOSIS — E039 Hypothyroidism, unspecified: Secondary | ICD-10-CM | POA: Insufficient documentation

## 2021-05-09 DIAGNOSIS — Z79899 Other long term (current) drug therapy: Secondary | ICD-10-CM | POA: Diagnosis not present

## 2021-05-09 DIAGNOSIS — R87623 High grade squamous intraepithelial lesion on cytologic smear of vagina (HGSIL): Secondary | ICD-10-CM | POA: Diagnosis not present

## 2021-05-09 NOTE — Progress Notes (Signed)
Gynecologic Oncology Consult Visit   Referring Provider: Dr. Ouida Sills   Chief Complaint: Vaginal dysplasia Subjective:  Cathy Hunt is a 71 y.o. G2P1 female who is seen in consultation from Dr. Ouida Sills for HSIL on pap s/p [facility] BS 5/17.   No complaints today. No discharge.    Colposcopy on 01/12/20 was negative. She was started on vaginal premarin and re-presented to clinic 05/17/20 for Pap and repeat colposcopy.  PAP LSIL. Colposcopic biopsy A. VAGINA, RIGHT FORNIX; BIOPSY:  - HIGH-GRADE SQUAMOUS INTRAEPITHELIAL LESION (VAIN 2).  Finished 10 weeks of Efudex 5/22 weeks ago.  Tolerated well with some irritation.  Had varicose vein treatment in left lower leg 2022.   Gynecologic History:  Cathy Hunt is a pleasant G2P1 female who is seen in consultation from Dr. Ouida Sills for HSIL on pap s/p [facility] BS 5/17.   CKC in 90s.   2017- ASCUS + HR HPV w/ repeat pap 6 months later with same result.   Repeat colposcopy in 07/2015 showed LGSIL in the ECC. Definitive procedure was recommended and she underwent TVH and BS with Dr. Ouida Sills on 08/21/2015 for CIN1 with positive ECC.   09/24/16- ASCUS  09/30/17- LSIL  10/14/18- LSIL  10/20/19- HSIL  Specimen A-Vaginal Biopsy, vaginal cuff 2 o'clock: BENIGN ATROPHIC SQUAMOUS MUCOSA. NEGATIVE FOR DYSPLASIA AND MALIGNANCY.  Specimen B-Vaginal Biopsy, vaginal cuff 3 o'clock: BENIGN ATROPHIC SQUAMOUS MUCOSA. NEGATIVE FOR DYSPLASIA AND MALIGNANCY.  Specimen C-Vaginal Biopsy, vaginal cuff 4 o'clock: TISSUE DID NOT SURVIVE PROCESSING.  She was seen by Dr. Fransisca Connors and he recommended repeat Pap with colposcopy in 4 months.   Colposcopy on 01/12/20 was negative. She was started on vaginal premarin and re-presented to clinic 05/17/20 for Pap and repeat colposcopy.  PAP LSIL. Colposcopic biopsy A. VAGINA, RIGHT FORNIX; BIOPSY:  - HIGH-GRADE SQUAMOUS INTRAEPITHELIAL LESION (VAIN 2).  Problem List: Patient Active Problem List   Diagnosis Date Noted    Varicose veins of leg with pain, bilateral 08/18/2020   Vaginal Pap smear with HGSIL 01/17/2020   Hypothyroidism, adult 04/14/2019   Postmenopausal osteoporosis 01/16/2016   Complete tear of left rotator cuff 11/17/2015   Rotator cuff tendinitis, left 11/17/2015   Postoperative state 08/21/2015    Past Medical History: Past Medical History:  Diagnosis Date   Arthritis    Cancer ([place])    Cervical   Complication of anesthesia    difficulty following verbal commands after vein ligation surgery   Dental crowns present    "a lot"   Headache    migraines   Hypothyroidism    Insomnia    PONV (postoperative nausea and vomiting)     Past Surgical History: Past Surgical History:  Procedure Laterality Date   APPENDECTOMY     BILATERAL SALPINGECTOMY  08/21/2015   Procedure: BILATERAL SALPINGECTOMY;  Surgeon: Gwen Her Schermerhorn, MD;  Location: [facility] ORS;  Service: Gynecology;;   BREAST EXCISIONAL BIOPSY Right 2008   neg   BREAST SURGERY Right    Lumpectomy   CERVICAL CONE BIOPSY  1984   COLONOSCOPY WITH PROPOFOL N/A 03/23/2015   Procedure: COLONOSCOPY WITH PROPOFOL;  Surgeon: Hulen Luster, MD;  Location: [facility] ENDOSCOPY;  Service: Gastroenterology;  Laterality: N/A;   DIAGNOSTIC LAPAROSCOPY     SHOULDER ARTHROSCOPY Left 12/13/2015   Procedure: arthroscopic debridement, arthroscopic subacromial decompression, and mini-open rotator cuff repair, left shoulder;  Surgeon: Corky Mull, MD;  Location: [place];  Service: Orthopedics;  Laterality: Left;  Patien requests to be first   TONSILLECTOMY  TUBAL LIGATION     VAGINAL HYSTERECTOMY N/A 08/21/2015   Procedure: HYSTERECTOMY VAGINAL;  Surgeon: Boykin Nearing, MD;  Location: [facility] ORS;  Service: Gynecology;  Laterality: N/A;   VAGINAL HYSTERECTOMY     VEIN LIGATION AND STRIPPING  1985   Past Gynecologic History:  Post menopausal.  Menarche: age 62 No pmb or hot flashes Not sexually active.  Hx of abnormal pap  smears Hx of cone biopsy, tubal ligation, hysterectomy  OB History: . G2P1 OB History  No obstetric history on file.   Family History: Family History  Problem Relation Age of Onset   Cervical cancer Mother    Heart attack Father    Cervical cancer Sister    Breast cancer Neg Hx    Social History: Social History   Socioeconomic History   Marital status: Divorced    Spouse name: Not on file   Number of children: Not on file   Years of education: Not on file   Highest education level: Not on file  Occupational History   Not on file  Tobacco Use   Smoking status: Never   Smokeless tobacco: Never  Substance and Sexual Activity   Alcohol use: Yes    Comment: social   Drug use: No   Sexual activity: Not on file  Other Topics Concern   Not on file  Social History Narrative   Not on file   Social Determinants of Health   Financial Resource Strain: Not on file  Food Insecurity: Not on file  Transportation Needs: Not on file  Physical Activity: Not on file  Stress: Not on file  Social Connections: Not on file  Intimate Partner Violence: Not on file   Immunization History  Administered Date(s) Administered   Influenza Inj Mdck Quad Pf 01/05/2016   Influenza Split 01/05/2015   Influenza-Unspecified 02/05/2017, 01/06/2018   PFIZER(Purple Top)SARS-COV-2 Vaccination 05/23/2019, 06/22/2019   Pneumococcal Conjugate-13 02/08/2016   Pneumococcal Polysaccharide-23 01/05/2015   Allergies: Allergies  Allergen Reactions   Codeine Other (See Comments)    "drop in blood pressure"   Penicillins Swelling   Phenergan [Promethazine Hcl] Other (See Comments)    "drop in blood pressure"   Promethazine Other (See Comments)    Low BP   Adhesive [Tape] Rash   Latex Rash   Current Medications: Current Outpatient Medications  Medication Sig Dispense Refill   levothyroxine (SYNTHROID) 112 MCG tablet Take 112 mcg by mouth daily.     Multiple Vitamins-Minerals (HM MULTIVITAMIN  ADULT GUMMY) CHEW Chew 1 Dose by mouth every morning.     predniSONE (DELTASONE) 20 MG tablet Take by mouth.     ALPRAZolam (XANAX) 0.5 MG tablet Take by mouth. (Patient not taking: Reported on 05/09/2021)     aspirin EC 81 MG tablet Take 81 mg by mouth daily. (Patient not taking: Reported on 05/09/2021)     aspirin-acetaminophen-caffeine (EXCEDRIN MIGRAINE) 250-250-65 MG tablet Take 2 tablets by mouth every 6 (six) hours as needed for headache. (Patient not taking: Reported on 05/09/2021)     Calcium Carb-Cholecalciferol 1000-800 MG-UNIT TABS Take 2 capsules by mouth daily. (Patient not taking: Reported on 05/09/2021)     docusate sodium (COLACE) 100 MG capsule Take 1 capsule (100 mg total) by mouth 2 (two) times daily. (Patient not taking: Reported on 05/09/2021) 30 capsule 0   etodolac (LODINE) 400 MG tablet Take 1 tablet (400 mg total) by mouth 2 (two) times daily. (Patient not taking: Reported on 05/09/2021) 60 tablet 3  levothyroxine (SYNTHROID, LEVOTHROID) 175 MCG tablet Take 150 mcg by mouth daily before breakfast.  (Patient not taking: Reported on 05/09/2021)     ondansetron (ZOFRAN ODT) 8 MG disintegrating tablet Take 1 tablet (8 mg total) by mouth every 8 (eight) hours as needed for nausea or vomiting. (Patient not taking: Reported on 05/09/2021) 30 tablet 0   oxyCODONE (ROXICODONE) 5 MG immediate release tablet Take 1-2 tablets (5-10 mg total) by mouth every 4 (four) hours as needed for severe pain. (Patient not taking: Reported on 05/09/2021) 60 tablet 0   PREMARIN vaginal cream INSERT 1/4 APPLICATORFUL VAGINALLY 2 TIMES A WEEK (Patient not taking: Reported on 05/09/2021) 30 g 2   tiZANidine (ZANAFLEX) 2 MG tablet Take 2 mg by mouth at bedtime as needed for muscle spasms. (Patient not taking: Reported on 05/09/2021)     No current facility-administered medications for this visit.   Review of Systems General:  no complaints Skin: no complaints Eyes: no complaints HEENT: no complaints Breasts: no  complaints Pulmonary: no complaints Cardiac: no complaints Gastrointestinal: no complaints Genitourinary/Sexual: no complaints Ob/Gyn: no complaints Musculoskeletal: no complaints Hematology: no complaints Neurologic/Psych: no complaints  Objective:  Physical Examination:  BP 113/74    Pulse 74    Temp 98.7 F (37.1 C)    Resp 20    Wt 130 lb 1.6 oz (59 kg)    SpO2 100%    BMI 21.00 kg/m     ECOG Performance Status: 0 - Asymptomatic  GENERAL: Patient is a well appearing female in no acute distress EXTREMITIES:  No peripheral edema.  Bruising on inner part of left lower leg s/p varicose vein treatment recently. SKIN:  Clear with no obvious rashes or skin changes.  NEURO:  Nonfocal. Well oriented.  Appropriate affect.  Pelvic: chaperoned by nursing.  EGBUS: no lesions Cervix: surgically absent Vagina:  atrophy, no lesions, no discharge or bleeding. Pap obtained Uterus: surgically absent BME: no palpable masses Rectovaginal:  deferred  Lab Review Labs on site today: none  Radiologic Imaging: None    Assessment:  Cathy Hunt is a 71 y.o. female diagnosed with HSIL PAP s/p TVH/BS for CIN1 in 2017. Prior CKC in the 1990s. Had low grade abnormal PAPs after hysterectomy LSIL/ASCUS, but now HSIL PAP 7/21.  Vaginal biopsies x 3 by Dr. Ouida Sills negative.  S/p vaginal ERT.  Colposcopy c/w VAIN in the right upper vaginal fornix 2/22 and biopsy showed VAIN2. Treated with 10 weeks of Efudex.  PAP 09/20/20 Atypical Squamous Cells of Undetermined Significance  No complaints today and presents for exam and PAP. Normal exam.  Non smoker and not immunosuppressed.      Plan:   Problem List Items Addressed This Visit       Other   Vaginal Pap smear with HGSIL - Primary   Follow up Pap results.  RTC in 6 months unless concerning symptoms in the interim.    She is not sexually active, but may want to pursue a relationship in the future.  Discussed significance of HPV and  transmission to partners.  She could use barrier contraception, but this is not absolutely needed.  Also discussed her worries about progression of VAIN to cancer. Told her that this was highly unlikely if she maintains close surveillance with our clinic.     The patient's diagnosis, an outline of the further diagnostic and laboratory studies which will be required, the recommendation for surgery, and alternatives were discussed with her and her accompanying family members.  All questions were answered to their satisfaction.  Mellody Drown, MD

## 2021-05-17 DIAGNOSIS — D2271 Melanocytic nevi of right lower limb, including hip: Secondary | ICD-10-CM | POA: Diagnosis not present

## 2021-05-17 DIAGNOSIS — D225 Melanocytic nevi of trunk: Secondary | ICD-10-CM | POA: Diagnosis not present

## 2021-05-17 DIAGNOSIS — D2261 Melanocytic nevi of right upper limb, including shoulder: Secondary | ICD-10-CM | POA: Diagnosis not present

## 2021-05-17 DIAGNOSIS — D2272 Melanocytic nevi of left lower limb, including hip: Secondary | ICD-10-CM | POA: Diagnosis not present

## 2021-05-17 DIAGNOSIS — D2262 Melanocytic nevi of left upper limb, including shoulder: Secondary | ICD-10-CM | POA: Diagnosis not present

## 2021-05-17 DIAGNOSIS — L708 Other acne: Secondary | ICD-10-CM | POA: Diagnosis not present

## 2021-05-20 LAB — IGP, APTIMA HPV, RFX 16/18,45: HPV Aptima: POSITIVE — AB

## 2021-05-20 LAB — HPV GENOTYPES 16/18,45
HPV Genotype 16: NEGATIVE
HPV Genotype 18,45: NEGATIVE

## 2021-07-30 DIAGNOSIS — H524 Presbyopia: Secondary | ICD-10-CM | POA: Diagnosis not present

## 2021-08-14 DIAGNOSIS — E039 Hypothyroidism, unspecified: Secondary | ICD-10-CM | POA: Diagnosis not present

## 2021-08-21 DIAGNOSIS — E039 Hypothyroidism, unspecified: Secondary | ICD-10-CM | POA: Diagnosis not present

## 2021-10-09 IMAGING — MG DIGITAL SCREENING BILAT W/ TOMO W/ CAD
8 of 14 series · 8 of 40 positions shown · non-contrast
Comparison: Previous exam(s).

CLINICAL DATA: Screening.

EXAM:
DIGITAL SCREENING BILATERAL MAMMOGRAM WITH TOMO AND CAD

[R MLO synth-2D (1 of 2)]
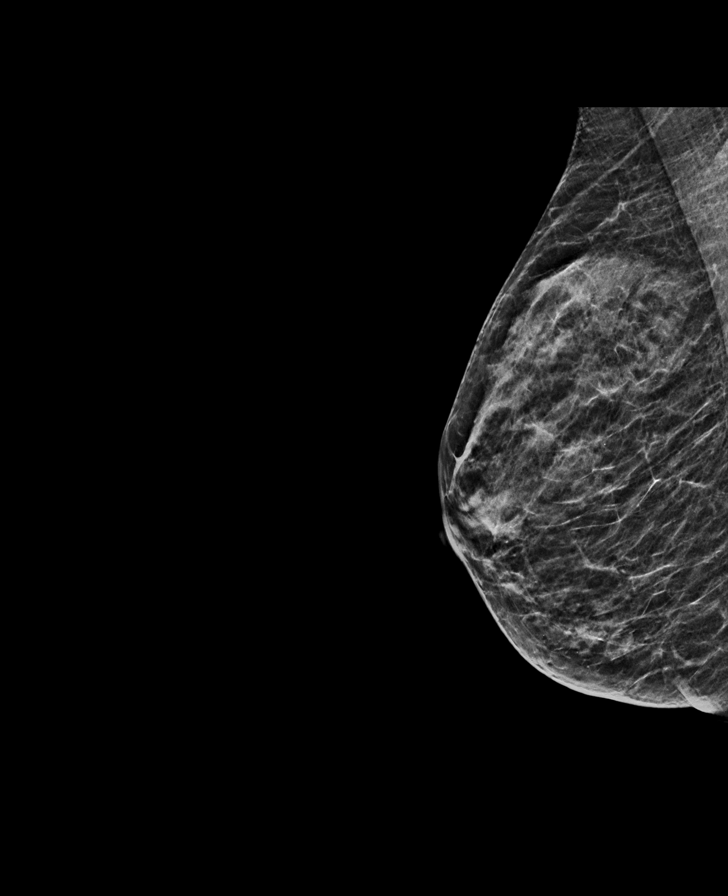

[L MLO synth-2D (1 of 2)]
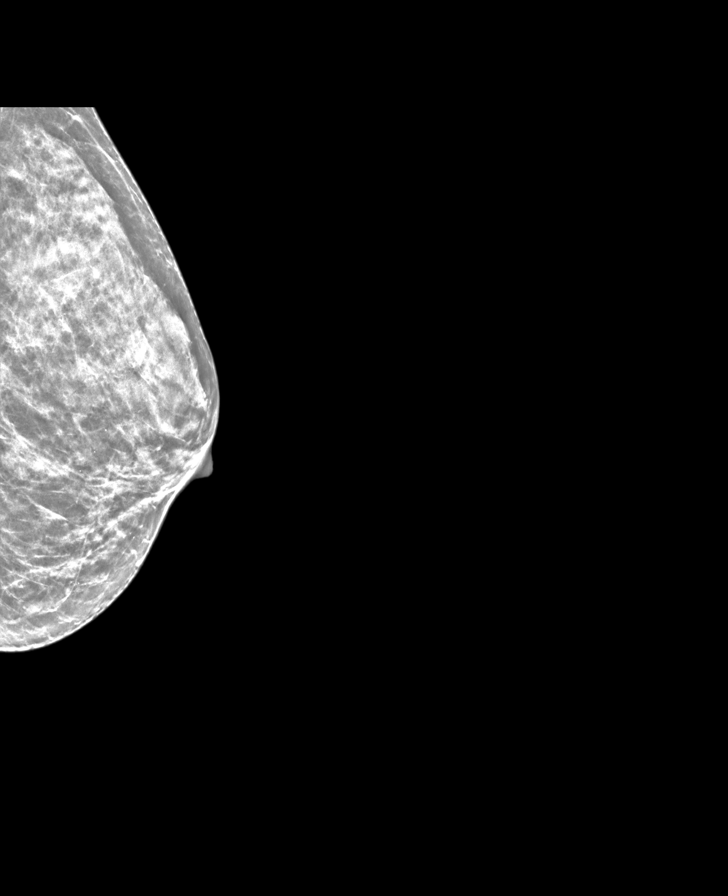

[R CC synth-2D (1 of 2)]
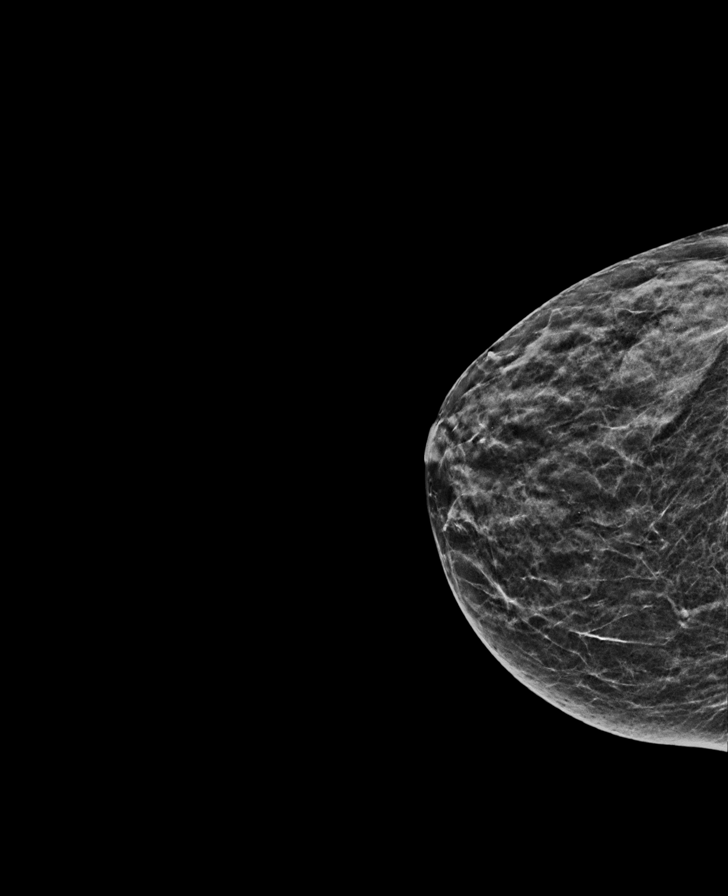

[R CC synth-2D (2 of 2)]
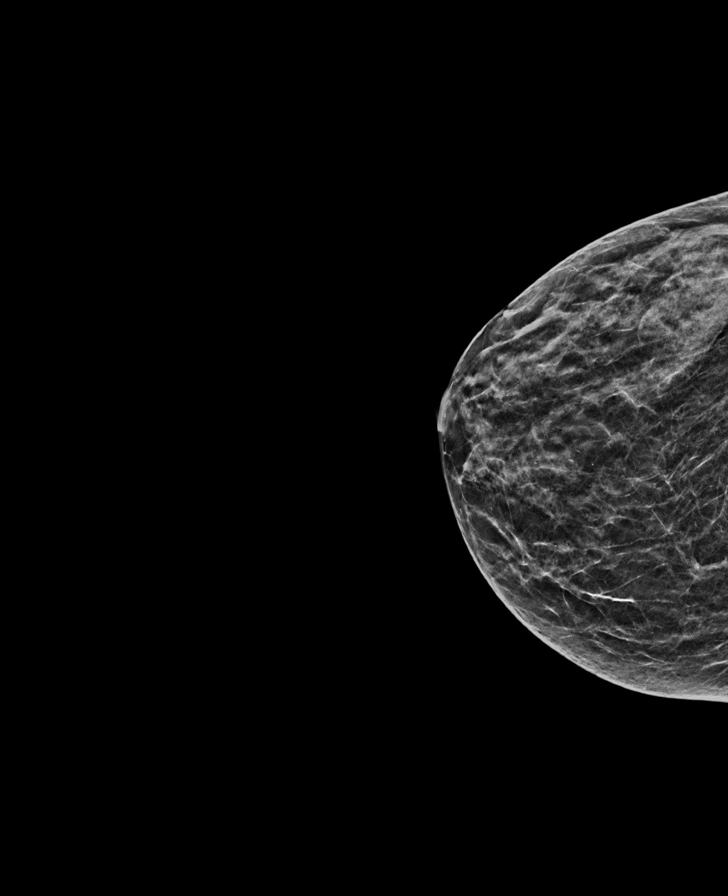

[R MLO synth-2D (2 of 2)]
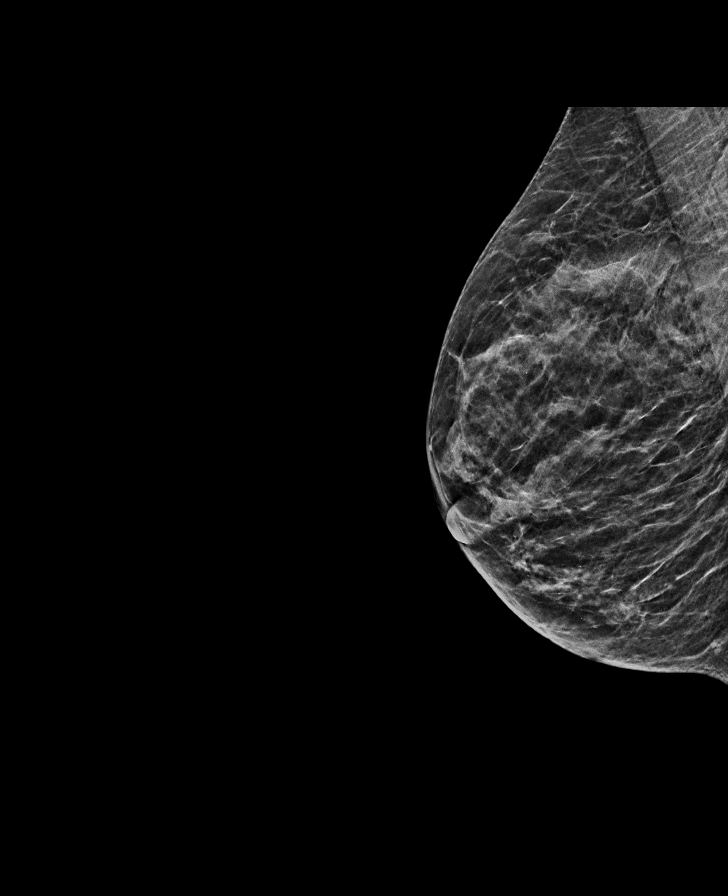

[L MLO synth-2D (2 of 2)]
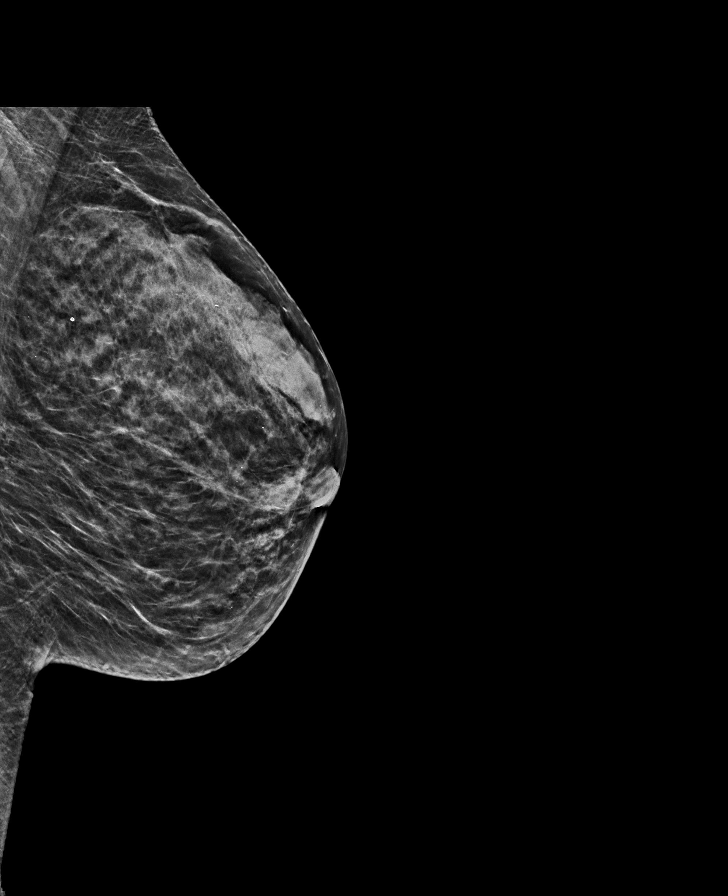

[L CC synth-2D]
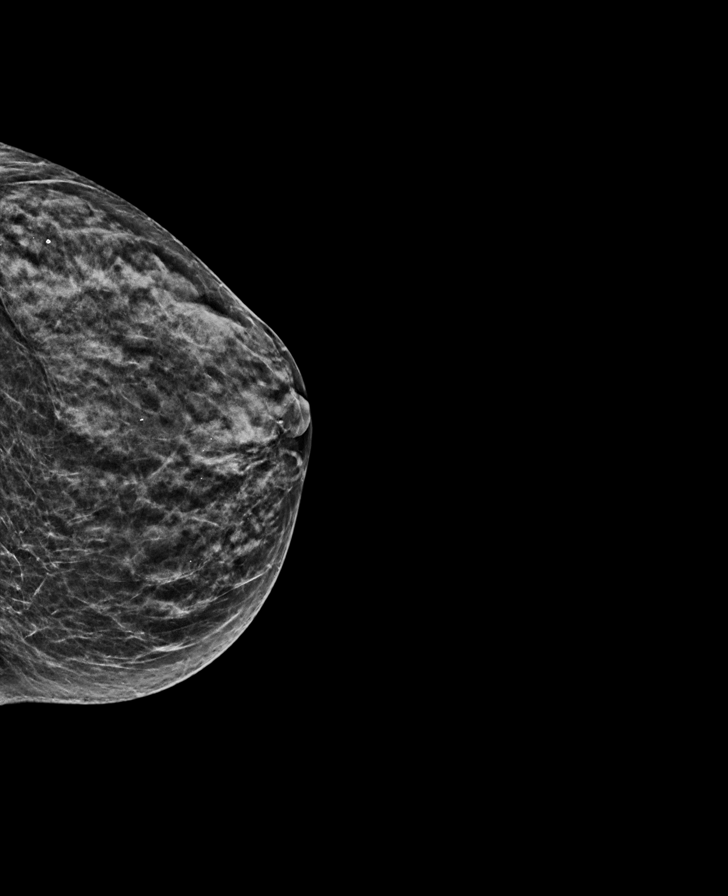

[L CC tomo · tomo slice 24/47.0]
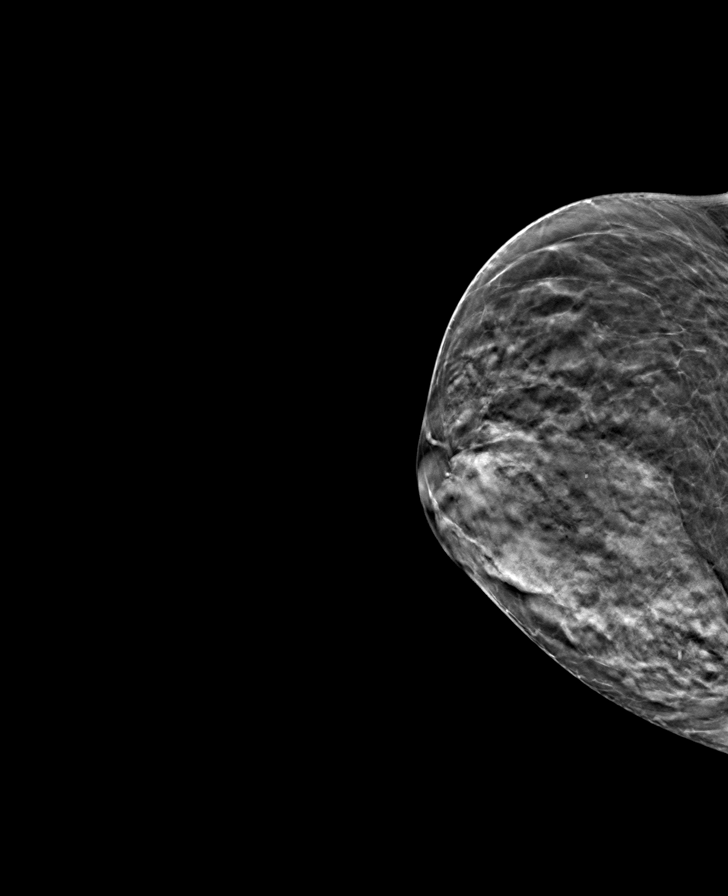

[8 of 40 positions shown; findings below may reference images not displayed]

ACR Breast Density Category c: The breast tissue is heterogeneously
dense, which may obscure small masses.
FINDINGS: In the left breast, a possible mass and distortion warrant further
evaluation. In the right breast, no findings suspicious for
malignancy.

Images were processed with CAD.
IMPRESSION: Further evaluation is suggested for a possible mass and distortion
in the left breast.

RECOMMENDATION:
Diagnostic mammogram and possibly ultrasound of the left breast.
(Code:[id number])

The patient will be contacted regarding the findings, and additional
imaging will be scheduled.

BI-RADS CATEGORY  0: Incomplete. Need additional imaging evaluation
and/or prior mammograms for comparison.

## 2021-10-24 DIAGNOSIS — E039 Hypothyroidism, unspecified: Secondary | ICD-10-CM | POA: Diagnosis not present

## 2021-10-31 DIAGNOSIS — Z1231 Encounter for screening mammogram for malignant neoplasm of breast: Secondary | ICD-10-CM | POA: Diagnosis not present

## 2021-10-31 DIAGNOSIS — Z0000 Encounter for general adult medical examination without abnormal findings: Secondary | ICD-10-CM | POA: Diagnosis not present

## 2021-10-31 DIAGNOSIS — M810 Age-related osteoporosis without current pathological fracture: Secondary | ICD-10-CM | POA: Diagnosis not present

## 2021-10-31 DIAGNOSIS — E039 Hypothyroidism, unspecified: Secondary | ICD-10-CM | POA: Diagnosis not present

## 2021-10-31 DIAGNOSIS — Z0001 Encounter for general adult medical examination with abnormal findings: Secondary | ICD-10-CM | POA: Diagnosis not present

## 2021-11-07 ENCOUNTER — Ambulatory Visit: Payer: PPO

## 2021-11-22 ENCOUNTER — Other Ambulatory Visit: Payer: Self-pay | Admitting: Internal Medicine

## 2021-11-22 DIAGNOSIS — N6489 Other specified disorders of breast: Secondary | ICD-10-CM

## 2021-12-03 ENCOUNTER — Ambulatory Visit: Payer: PPO

## 2021-12-05 ENCOUNTER — Ambulatory Visit: Payer: PPO

## 2022-01-04 ENCOUNTER — Ambulatory Visit
Admission: RE | Admit: 2022-01-04 | Discharge: 2022-01-04 | Disposition: A | Discharge status: Home / Self Care | Payer: PPO | Source: Ambulatory Visit | Attending: Internal Medicine | Admitting: Internal Medicine

## 2022-01-04 DIAGNOSIS — N6489 Other specified disorders of breast: Secondary | ICD-10-CM | POA: Diagnosis not present

## 2022-01-16 ENCOUNTER — Ambulatory Visit: Payer: PPO

## 2022-01-23 ENCOUNTER — Inpatient Hospital Stay: Payer: PPO | Attending: Obstetrics and Gynecology | Admitting: Obstetrics and Gynecology

## 2022-01-23 VITALS — BP 127/78 | HR 67 | Temp 98.3°F | Resp 20 | Wt 127.4 lb

## 2022-01-23 DIAGNOSIS — R87623 High grade squamous intraepithelial lesion on cytologic smear of vagina (HGSIL): Secondary | ICD-10-CM | POA: Insufficient documentation

## 2022-01-23 NOTE — Progress Notes (Signed)
Gynecologic Oncology Consult Visit   Referring Provider: Dr. Ouida Sills   Chief Complaint: Vaginal dysplasia Subjective:  Cathy Hunt is a 71 y.o. G2P1 female who is seen in consultation from Dr. Ouida Sills for HSIL on pap s/p [facility] BS 5/17.   No complaints today. No discharge.    05/09/21 PAP NEGATIVE FOR INTRAEPITHELIAL LESION OR MALIGNANCY.  REACTIVE CELLULAR CHANGES AND/OR REPAIR ARE PRESENT.  CELLULAR CHANGES ASSOCIATED WITH ATROPHY ARE PRESENT.  HR HPV positive.   Gynecologic History:  Cathy Hunt is a pleasant G2P1 female who is seen in consultation from Dr. Ouida Sills for HSIL on pap s/p [facility] BS 5/17.   CKC in 90s.   2017- ASCUS + HR HPV w/ repeat pap 6 months later with same result.   Repeat colposcopy in 07/2015 showed LGSIL in the ECC. Definitive procedure was recommended and she underwent TVH and BS with Dr. Ouida Sills on 08/21/2015 for CIN1 with positive ECC.   09/24/16- ASCUS  09/30/17- LSIL  10/14/18- LSIL  10/20/19- HSIL  Specimen A-Vaginal Biopsy, vaginal cuff 2 o'clock: BENIGN ATROPHIC SQUAMOUS MUCOSA. NEGATIVE FOR DYSPLASIA AND MALIGNANCY.  Specimen B-Vaginal Biopsy, vaginal cuff 3 o'clock: BENIGN ATROPHIC SQUAMOUS MUCOSA. NEGATIVE FOR DYSPLASIA AND MALIGNANCY.  Specimen C-Vaginal Biopsy, vaginal cuff 4 o'clock: TISSUE DID NOT SURVIVE PROCESSING.  She was seen by Dr. Fransisca Connors and he recommended repeat Pap with colposcopy in 4 months.   Colposcopy on 01/12/20 was negative. She was started on vaginal premarin and re-presented to clinic 05/17/20 for Pap and repeat colposcopy.  PAP LSIL. Colposcopic biopsy A. VAGINA, RIGHT FORNIX; BIOPSY:  - HIGH-GRADE SQUAMOUS INTRAEPITHELIAL LESION (VAIN 2).  Finished 10 weeks of Efudex 5/22.  Tolerated well with some irritation.  Had varicose vein treatment in left lower leg 2022.   Problem List: Patient Active Problem List   Diagnosis Date Noted   Varicose veins of leg with pain, bilateral 08/18/2020   Vaginal Pap  smear with HGSIL 01/17/2020   Hypothyroidism, adult 04/14/2019   Postmenopausal osteoporosis 01/16/2016   Complete tear of left rotator cuff 11/17/2015   Rotator cuff tendinitis, left 11/17/2015   Postoperative state 08/21/2015    Past Medical History: Past Medical History:  Diagnosis Date   Arthritis    Cancer ([place])    Cervical   Complication of anesthesia    difficulty following verbal commands after vein ligation surgery   Dental crowns present    "a lot"   Headache    migraines   Hypothyroidism    Insomnia    PONV (postoperative nausea and vomiting)     Past Surgical History: Past Surgical History:  Procedure Laterality Date   APPENDECTOMY     BILATERAL SALPINGECTOMY  08/21/2015   Procedure: BILATERAL SALPINGECTOMY;  Surgeon: Gwen Her Schermerhorn, MD;  Location: [facility] ORS;  Service: Gynecology;;   BREAST EXCISIONAL BIOPSY Right 2008   neg   BREAST SURGERY Right    Lumpectomy   CERVICAL CONE BIOPSY  1984   COLONOSCOPY WITH PROPOFOL N/A 03/23/2015   Procedure: COLONOSCOPY WITH PROPOFOL;  Surgeon: Hulen Luster, MD;  Location[facility] END[facility]COPY;  Service: Gastroenterology;  Laterality: N/A;   DIAGNOSTIC LAPAROSCOPY     SHOULDER ARTHROSCOPY Left 12/13/2015   Procedure: arthroscopic debridement, arthroscopic subacromial decompression, and mini-open rotator cuff repair, left shoulder;  Surgeon: Corky Mull, MD;  Location: [place];  Service: Orthopedics;  Laterality: Left;  Patien requests to be first   TONSILLECTOMY     TUBAL LIGATION     VAGINAL HYSTERECTOMY N/A  08/21/2015   Procedure: HYSTERECTOMY VAGINAL;  Surgeon: Boykin Nearing, MD;  Location: [facility] ORS;  Service: Gynecology;  Laterality: N/A;   VAGINAL HYSTERECTOMY     VEIN LIGATION AND STRIPPING  1985   Past Gynecologic History:  Post menopausal.  Menarche: age 57 No pmb or hot flashes Not sexually active.  Hx of abnormal pap smears Hx of cone biopsy, tubal ligation, hysterectomy  OB History:  . G2P1 OB History  No obstetric history on file.   Family History: Family History  Problem Relation Age of Onset   Cervical cancer Mother    Heart attack Father    Cervical cancer Sister    Breast cancer Neg Hx    Social History: Social History   Socioeconomic History   Marital status: Divorced    Spouse name: Not on file   Number of children: Not on file   Years of education: Not on file   Highest education level: Not on file  Occupational History   Not on file  Tobacco Use   Smoking status: Never   Smokeless tobacco: Never  Substance and Sexual Activity   Alcohol use: Yes    Comment: social   Drug use: No   Sexual activity: Not on file  Other Topics Concern   Not on file  Social History Narrative   Not on file   Social Determinants of Health   Financial Resource Strain: Not on file  Food Insecurity: Not on file  Transportation Needs: Not on file  Physical Activity: Not on file  Stress: Not on file  Social Connections: Not on file  Intimate Partner Violence: Not on file   Immunization History  Administered Date(s) Administered   Influenza Inj Mdck Quad Pf 01/05/2016   Influenza Split 01/05/2015   Influenza-Unspecified 02/05/2017, 01/06/2018   PFIZER(Purple Top)SARS-COV-2 Vaccination 05/23/2019, 06/22/2019   Pneumococcal Conjugate-13 02/08/2016   Pneumococcal Polysaccharide-23 01/05/2015   Allergies: Allergies  Allergen Reactions   Codeine Other (See Comments)    "drop in blood pressure"   Penicillins Swelling   Phenergan [Promethazine Hcl] Other (See Comments)    "drop in blood pressure"   Promethazine Other (See Comments)    Low BP   Adhesive [Tape] Rash   Latex Rash   Current Medications: Current Outpatient Medications  Medication Sig Dispense Refill   Calcium Carb-Cholecalciferol 1000-800 MG-UNIT TABS Take 2 capsules by mouth daily.     levothyroxine (SYNTHROID) 112 MCG tablet Take 112 mcg by mouth daily.     ALPRAZolam (XANAX) 0.5 MG  tablet Take by mouth. (Patient not taking: Reported on 05/09/2021)     aspirin EC 81 MG tablet Take 81 mg by mouth daily. (Patient not taking: Reported on 05/09/2021)     aspirin-acetaminophen-caffeine (EXCEDRIN MIGRAINE) 250-250-65 MG tablet Take 2 tablets by mouth every 6 (six) hours as needed for headache. (Patient not taking: Reported on 05/09/2021)     docusate sodium (COLACE) 100 MG capsule Take 1 capsule (100 mg total) by mouth 2 (two) times daily. (Patient not taking: Reported on 05/09/2021) 30 capsule 0   etodolac (LODINE) 400 MG tablet Take 1 tablet (400 mg total) by mouth 2 (two) times daily. (Patient not taking: Reported on 05/09/2021) 60 tablet 3   levothyroxine (SYNTHROID, LEVOTHROID) 175 MCG tablet Take 150 mcg by mouth daily before breakfast.  (Patient not taking: Reported on 05/09/2021)     Multiple Vitamins-Minerals (HM MULTIVITAMIN ADULT GUMMY) CHEW Chew 1 Dose by mouth every morning. (Patient not taking: Reported on 01/23/2022)  ondansetron (ZOFRAN ODT) 8 MG disintegrating tablet Take 1 tablet (8 mg total) by mouth every 8 (eight) hours as needed for nausea or vomiting. (Patient not taking: Reported on 05/09/2021) 30 tablet 0   oxyCODONE (ROXICODONE) 5 MG immediate release tablet Take 1-2 tablets (5-10 mg total) by mouth every 4 (four) hours as needed for severe pain. (Patient not taking: Reported on 05/09/2021) 60 tablet 0   predniSONE (DELTASONE) 20 MG tablet Take by mouth. (Patient not taking: Reported on 01/23/2022)     PREMARIN vaginal cream INSERT 1/4 APPLICATORFUL VAGINALLY 2 TIMES A WEEK (Patient not taking: Reported on 05/09/2021) 30 g 2   tiZANidine (ZANAFLEX) 2 MG tablet Take 2 mg by mouth at bedtime as needed for muscle spasms. (Patient not taking: Reported on 05/09/2021)     No current facility-administered medications for this visit.   Review of Systems General:  no complaints Skin: no complaints Eyes: no complaints HEENT: no complaints Breasts: no complaints Pulmonary: no  complaints Cardiac: no complaints Gastrointestinal: no complaints Genitourinary/Sexual: no complaints Ob/Gyn: no complaints Musculoskeletal: no complaints Hematology: no complaints Neurologic/Psych: no complaints  Objective:  Physical Examination:  BP 127/78   Pulse 67   Temp 98.3 F (36.8 C)   Resp 20   Wt 127 lb 6.4 oz (57.8 kg)   SpO2 100%   BMI 20.56 kg/m     ECOG Performance Status: 0 - Asymptomatic  GENERAL: Patient is a well appearing female in no acute distress EXTREMITIES:  No peripheral edema.  Bruising on inner part of left lower leg s/p varicose vein treatment recently. SKIN:  Clear with no obvious rashes or skin changes.  NEURO:  Nonfocal. Well oriented.  Appropriate affect.  Pelvic: chaperoned by nursing.  EGBUS: no lesions Cervix: surgically absent Vagina:  atrophy, no lesions, no discharge or bleeding. Pap obtained Uterus: surgically absent BME: no palpable masses Rectovaginal:  deferred  Lab Review Labs on site today: none  Radiologic Imaging: None    Assessment:  Cathy Hunt is a 71 y.o. female diagnosed with HSIL PAP s/p TVH/BS for CIN1 in 2017. Prior CKC in the 1990s. Had low grade abnormal PAPs after hysterectomy LSIL/ASCUS, but now HSIL PAP 7/21.  Vaginal biopsies x 3 by Dr. Ouida Sills negative.  S/p vaginal ERT.  Colposcopy c/w VAIN in the right upper vaginal fornix 2/22 and biopsy showed VAIN2. Treated with 10 weeks of Efudex.  PAP 09/20/20 Atypical Squamous Cells of Undetermined Significance  PAP 2/23 normal.  HR HPV +.  No complaints today and presents for exam and PAP. Normal exam.    Non smoker and not immunosuppressed.      Plan:   Problem List Items Addressed This Visit       Other   Vaginal Pap smear with HGSIL - Primary   Follow up Pap results.  She will follow up with [facility] in one year or sooner should any concerning symptoms arise.  We can see her back in the future should the need arise.     She is not sexually  active, but may want to pursue a relationship in the future.  Discussed significance of HPV and transmission to partners.  She could use barrier contraception, but this is not absolutely needed.  Also discussed her worries about progression of VAIN to cancer. Told her that this was highly unlikely if she maintains close surveillance with our clinic.     The patient's diagnosis, an outline of the further diagnostic and laboratory studies which will  be required, the recommendation for surgery, and alternatives were discussed with her and her accompanying family members.  All questions were answered to their satisfaction.  Cathy Drown, MD

## 2022-01-30 LAB — IGP, APTIMA HPV: HPV Aptima: NEGATIVE

## 2022-02-04 ENCOUNTER — Encounter (INDEPENDENT_AMBULATORY_CARE_PROVIDER_SITE_OTHER): Payer: Self-pay

## 2022-02-14 DIAGNOSIS — E039 Hypothyroidism, unspecified: Secondary | ICD-10-CM | POA: Diagnosis not present

## 2022-02-21 DIAGNOSIS — E039 Hypothyroidism, unspecified: Secondary | ICD-10-CM | POA: Diagnosis not present

## 2022-03-22 ENCOUNTER — Emergency Department: Payer: PPO

## 2022-03-22 ENCOUNTER — Emergency Department
Admission: EM | Admit: 2022-03-22 | Discharge: 2022-03-22 | Disposition: A | Discharge status: Home / Self Care | Payer: PPO | Attending: Emergency Medicine | Admitting: Emergency Medicine

## 2022-03-22 ENCOUNTER — Other Ambulatory Visit: Payer: Self-pay

## 2022-03-22 DIAGNOSIS — R030 Elevated blood-pressure reading, without diagnosis of hypertension: Secondary | ICD-10-CM | POA: Diagnosis not present

## 2022-03-22 DIAGNOSIS — R7989 Other specified abnormal findings of blood chemistry: Secondary | ICD-10-CM | POA: Diagnosis not present

## 2022-03-22 DIAGNOSIS — R0602 Shortness of breath: Secondary | ICD-10-CM | POA: Insufficient documentation

## 2022-03-22 DIAGNOSIS — Z8541 Personal history of malignant neoplasm of cervix uteri: Secondary | ICD-10-CM | POA: Insufficient documentation

## 2022-03-22 DIAGNOSIS — E039 Hypothyroidism, unspecified: Secondary | ICD-10-CM | POA: Insufficient documentation

## 2022-03-22 DIAGNOSIS — R0789 Other chest pain: Secondary | ICD-10-CM | POA: Diagnosis not present

## 2022-03-22 DIAGNOSIS — M25511 Pain in right shoulder: Secondary | ICD-10-CM | POA: Insufficient documentation

## 2022-03-22 DIAGNOSIS — J449 Chronic obstructive pulmonary disease, unspecified: Secondary | ICD-10-CM | POA: Diagnosis not present

## 2022-03-22 DIAGNOSIS — R079 Chest pain, unspecified: Secondary | ICD-10-CM | POA: Diagnosis not present

## 2022-03-22 LAB — CBC
HCT: 40.6 % (ref 36.0–46.0)
Hemoglobin: 12.8 g/dL (ref 12.0–15.0)
MCH: 30.3 pg (ref 26.0–34.0)
MCHC: 31.5 g/dL (ref 30.0–36.0)
MCV: 96.2 fL (ref 80.0–100.0)
Platelets: 259 10*3/uL (ref 150–400)
RBC: 4.22 MIL/uL (ref 3.87–5.11)
RDW: 12.6 % (ref 11.5–15.5)
WBC: 6.9 10*3/uL (ref 4.0–10.5)
nRBC: 0 % (ref 0.0–0.2)

## 2022-03-22 LAB — TROPONIN I (HIGH SENSITIVITY)
Troponin I (High Sensitivity): <2 ng/L (ref <18)
Troponin I (High Sensitivity): 2 ng/L (ref <18)

## 2022-03-22 LAB — D-DIMER, QUANTITATIVE: D-Dimer, Quant: 1.17 ug/mL-FEU — ABNORMAL HIGH (ref 0.00–0.50)

## 2022-03-22 LAB — BASIC METABOLIC PANEL
Anion gap: 9 (ref 5–15)
BUN: 12 mg/dL (ref 8–23)
CO2: 25 mmol/L (ref 22–32)
Calcium: 9.6 mg/dL (ref 8.9–10.3)
Chloride: 102 mmol/L (ref 98–111)
Creatinine, Ser: 0.77 mg/dL (ref 0.44–1.00)
GFR, Estimated: >60 mL/min (ref >60)
Glucose, Bld: 103 mg/dL — ABNORMAL HIGH (ref 70–99)
Potassium: 4.3 mmol/L (ref 3.5–5.1)
Sodium: 136 mmol/L (ref 135–145)

## 2022-03-22 MED ORDER — IOHEXOL 350 MG/ML SOLN
75.0000 mL | Freq: Once | INTRAVENOUS | Status: AC | PRN
  Administered 2022-03-22: 75 mL via INTRAVENOUS

## 2022-03-22 MED ORDER — DOXYCYCLINE HYCLATE 100 MG PO TABS: 100.0000 mg | ORAL_TABLET | Freq: Two times a day (BID) | ORAL | 0 refills | Status: AC

## 2022-03-22 MED ORDER — OXYCODONE-ACETAMINOPHEN 5-325 MG PO TABS
1.0000 | ORAL_TABLET | Freq: Once | ORAL | Status: DC
  Filled 2022-03-22: qty 1

## 2022-03-22 MED ORDER — DOXYCYCLINE HYCLATE 100 MG PO TABS
100.0000 mg | ORAL_TABLET | Freq: Once | ORAL | Status: AC
  Administered 2022-03-22: 100 mg via ORAL
  Filled 2022-03-22: qty 1

## 2022-03-22 NOTE — ED Notes (Signed)
Patient signed electronic pinpad for d/c paperwork but it did not register in system.

## 2022-03-22 NOTE — Discharge Instructions (Addendum)
IMPRESSION: 1. No evidence for pulmonary embolism. 2. Tree-in-bud opacities in the superior segment of the right lower lobe, likely infectious/inflammatory. 3. 5 mm right solid pulmonary nodule within the upper lobe. Per Fleischner Society Guidelines, if patient is low risk for malignancy, no routine follow-up imaging is recommended. If patient is high risk for malignancy, a non-contrast Chest CT at 12 months is optional. If performed and the nodule is stable at 12 months, no further follow-up is recommended. These guidelines do not apply to immunocompromised patients and patients with cancer. Follow up in patients with significant comorbidities as clinically warranted. For lung cancer screening, adhere to Lung-RADS guidelines. Reference: Radiology. 2017; 284(1):228-43.

## 2022-03-22 NOTE — ED Triage Notes (Signed)
Pt comes with c/o severe cp in mid sternal and right arm pain. Pt states this all started about hour ago. Pt states sob. Pt denies any N.V/

## 2022-03-22 NOTE — ED Provider Notes (Signed)
Patient received in signout from Dr. Starleen Blue pending follow-up repeat troponin as well as D-dimer.  D-dimer was elevated.  Troponin negative.  Discussed concern with ACS.  CTA was ordered which does not show evidence of PE but possible infectious or inflammatory process.  She is not hypoxic no fever.  I will cover for possible infectious process.  Believe she stable appropriate for outpatient follow-up.   Merlyn Lot, MD 03/22/22 [phone]

## 2022-03-22 NOTE — ED Provider Notes (Signed)
[facility] Provider Note    Event Date/Time   First MD Initiated Contact with Patient 03/22/22 1345     (approximate)   History   Chest Pain   HPI  Cathy Hunt is a 71 y.o. female past medical history of hypothyroidism who presents with chest pain and shortness of breath.  Patient tells me she has been feeling short of breath since she got her RSV vaccine 3 weeks ago.  Feels like she cannot take an deep breath.  Then around 9 AM today patient was sitting down when she developed sharp pain in the right side of her chest rating to the right shoulder and down her arm.  She still has some general pressure on the right side of her chest as well as pain in the right arm.  Denies any trauma.  Still feels short of breath but this has been the same since 3 weeks ago.  Denies cough fevers chills.  No lower extremity swelling.  Patient does have a history of DVT many years ago in the setting of a miscarriage.     Past Medical History:  Diagnosis Date   Arthritis    Cancer ([place])    Cervical   Complication of anesthesia    difficulty following verbal commands after vein ligation surgery   Dental crowns present    "a lot"   Headache    migraines   Hypothyroidism    Insomnia    PONV (postoperative nausea and vomiting)     Patient Active Problem List   Diagnosis Date Noted   Varicose veins of leg with pain, bilateral 08/18/2020   Vaginal Pap smear with HGSIL 01/17/2020   Hypothyroidism, adult 04/14/2019   Postmenopausal osteoporosis 01/16/2016   Complete tear of left rotator cuff 11/17/2015   Rotator cuff tendinitis, left 11/17/2015   Postoperative state 08/21/2015     Physical Exam  Triage Vital Signs: ED Triage Vitals  Enc Vitals Group     BP 03/22/22 1050 135/88     Pulse Rate 03/22/22 1050 70     Resp 03/22/22 1050 16     Temp 03/22/22 1050 97.9 F (36.6 C)     Temp src --      SpO2 03/22/22 1054 100 %     Weight 03/22/22 1054 125 lb (56.7  kg)     Height 03/22/22 1054 '5\' 6"'$  (1.676 m)     Head Circumference --      Peak Flow --      Pain Score 03/22/22 1042 10     Pain Loc --      Pain Edu? --      Excl. in [place]? --     Most recent vital signs: Vitals:   03/22/22 1338 03/22/22 1400  BP: (!) 141/83 (!) 144/111  Pulse: (!) 57 (!) 58  Resp: 14 14  Temp:    SpO2:  100%     General: Awake, no distress.  CV:  Good peripheral perfusion.  No peripheral edema or signs of DVT Resp:  Normal effort.  Lungs are clear Abd:  No distention.  Neuro:             Awake, Alert, Oriented x 3  Other:  Tenderness to palpation over the right anterior shoulder, significant pain with abduction of the arm and internal rotation 2+ radial pulse Intact okay sign, finger abduction and thumbs up   ED Results / Procedures / Treatments  Labs (all labs ordered are  listed, but only abnormal results are displayed) Labs Reviewed  BASIC METABOLIC PANEL - Abnormal; Notable for the following components:      Result Value   Glucose, Bld 103 (*)    All other components within normal limits  CBC  D-DIMER, QUANTITATIVE  TROPONIN I (HIGH SENSITIVITY)  TROPONIN I (HIGH SENSITIVITY)     EKG  EKG interpretation performed by myself: NSR, nml axis, nml intervals, no acute ischemic changes    RADIOLOGY I reviewed and interpreted the CXR which does not show any acute cardiopulmonary process    PROCEDURES:  Critical Care performed: No  Procedures  The patient is on the cardiac monitor to evaluate for evidence of arrhythmia and/or significant heart rate changes.   MEDICATIONS ORDERED IN ED: Medications  oxyCODONE-acetaminophen (PERCOCET/ROXICET) 5-325 MG per tablet 1 tablet (has no administration in time range)     IMPRESSION / MDM / ASSESSMENT AND PLAN / ED COURSE  I reviewed the triage vital signs and the nursing notes.                              Patient's presentation is most consistent with acute presentation with potential  threat to life or bodily function.  Differential diagnosis includes, but is not limited to, ACS, PE, rotator cuff pathology, musculoskeletal pain  Patient is a 71 year old female who is relatively healthy presents with shortness of breath x 3 weeks and chest pain today.  She endorses dyspnea and feels like she cannot take a deep breath since getting the RSV vaccine.  She has however rather active and is not having any exertional symptoms.  Did not have chest pain until this morning when she had acute onset of sharp pain that radiated down the right arm.  She continues to have right arm pain as well as some pressure on the right side of her chest.  On exam patient has significant pain with abduction and internal rotation of her arm which to me says just likely impingement syndrome and her pain is clearly reproduced with this so I suspect that her pain in the chest is primarily musculoskeletal.  She is still however endorsing difficulty taking a deep breath and dyspnea.  Patient does have history of PE.  I think it is very unlikely that she has a PE today as she is bradycardic and satting 100% but will send D-dimer out of an abundance of caution.  I signed out to the oncoming provider.       FINAL CLINICAL IMPRESSION(S) / ED DIAGNOSES   Final diagnoses:  Shortness of breath  Acute pain of right shoulder     Rx / DC Orders   ED Discharge Orders     None        Note:  This document was prepared using Dragon voice recognition software and may include unintentional dictation errors.   Rada Hay, MD 03/22/22 [phone]

## 2022-03-26 DIAGNOSIS — J188 Other pneumonia, unspecified organism: Secondary | ICD-10-CM | POA: Diagnosis not present

## 2022-03-26 DIAGNOSIS — J984 Other disorders of lung: Secondary | ICD-10-CM | POA: Diagnosis not present

## 2022-03-27 DIAGNOSIS — R0602 Shortness of breath: Secondary | ICD-10-CM | POA: Diagnosis not present

## 2022-05-15 ENCOUNTER — Ambulatory Visit: Payer: PPO | Attending: Cardiovascular Disease | Admitting: Cardiovascular Disease

## 2022-05-15 ENCOUNTER — Encounter: Payer: Self-pay | Admitting: Cardiovascular Disease

## 2022-05-15 VITALS — BP 120/66 | HR 64 | Ht 66.0 in | Wt 129.1 lb

## 2022-05-15 DIAGNOSIS — I2510 Atherosclerotic heart disease of native coronary artery without angina pectoris: Secondary | ICD-10-CM

## 2022-05-15 DIAGNOSIS — R0602 Shortness of breath: Secondary | ICD-10-CM

## 2022-05-15 DIAGNOSIS — R0789 Other chest pain: Secondary | ICD-10-CM

## 2022-05-15 DIAGNOSIS — I700 Atherosclerosis of aorta: Secondary | ICD-10-CM | POA: Diagnosis not present

## 2022-05-15 DIAGNOSIS — I2584 Coronary atherosclerosis due to calcified coronary lesion: Secondary | ICD-10-CM | POA: Diagnosis not present

## 2022-05-15 MED ORDER — ROSUVASTATIN CALCIUM 5 MG PO TABS: 5.0000 mg | ORAL_TABLET | Freq: Every day | ORAL | 6 refills | Status: AC

## 2022-05-15 NOTE — Progress Notes (Signed)
Cardiology Office Note  Date:  05/15/2022   ID:  Cathy Hunt, DOB [birth year], MRN [id number]  PCP:  Baxter Hire, MD   Chief Complaint  Patient presents with   New Patient (Initial Visit)    Ref by Dr. [name] for chest pain & family Hx of CAD with her dad having an MI at age 73. Patient c/o short of breath since she got her RSV vaccine & had noticed chest pain and left arm pain.     HPI:  Cathy Hunt is a 71 year old woman with past medical history of Hypothyroidism History of DVT in the setting of miscarriage Who presents by referral from Dr. Quentin Cornwall in the ER for atypical chest pain  Recent presentation to the emergency room March 22, 2022 for shortness of breath, right side chest pain, right arm pain Symptoms for 3 weeks since RSV vaccine Troponin negative D-dimer elevated 1.17 CT chest in the ER, no PE Tree-in-bud opacities in the superior segment of the right lower lobe, likely infectious/inflammatory. Was started on doxycycline Seen by primary care, started on prednisone taper, referred to pulmonary  Currently active, walks, pickle ball, yard work Reports that chest pain and shortness of breath has resolved  Lab work reviewed Total cholesterol 225 LDL 143  CT scan chest images pulled up and reviewed personally by myself  showing mild coronary artery calcification, mild aortic atherosclerosis descending aorta  EKG personally reviewed by myself on todays visit Normal sinus rhythm rate 64 bpm no significant ST-T wave changes  PMH:   has a past medical history of Arthritis, Cancer ([place]), Complication of anesthesia, Dental crowns present, Headache, Hypothyroidism, Insomnia, and PONV (postoperative nausea and vomiting).  PSH:    Past Surgical History:  Procedure Laterality Date   APPENDECTOMY     BILATERAL SALPINGECTOMY  08/21/2015   Procedure: BILATERAL SALPINGECTOMY;  Surgeon: Gwen Her Schermerhorn, MD;  Location: [facility] ORS;  Service: Gynecology;;    BREAST EXCISIONAL BIOPSY Right 2008   neg   BREAST SURGERY Right    Lumpectomy   CERVICAL CONE BIOPSY  1984   COLONOSCOPY WITH PROPOFOL N/A 03/23/2015   Procedure: COLONOSCOPY WITH PROPOFOL;  Surgeon: Hulen Luster, MD;  Location: [facility] ENDOSCOPY;  Service: Gastroenterology;  Laterality: N/A;   DIAGNOSTIC LAPAROSCOPY     SHOULDER ARTHROSCOPY Left 12/13/2015   Procedure: arthroscopic debridement, arthroscopic subacromial decompression, and mini-open rotator cuff repair, left shoulder;  Surgeon: Corky Mull, MD;  Location: [place];  Service: Orthopedics;  Laterality: Left;  Patien requests to be first   TONSILLECTOMY     TUBAL LIGATION     VAGINAL HYSTERECTOMY N/A 08/21/2015   Procedure: HYSTERECTOMY VAGINAL;  Surgeon: Boykin Nearing, MD;  Location: [facility] ORS;  Service: Gynecology;  Laterality: N/A;   VAGINAL HYSTERECTOMY     VEIN LIGATION AND STRIPPING  1985    Current Outpatient Medications  Medication Sig Dispense Refill   aspirin EC 81 MG tablet Take 81 mg by mouth daily.     aspirin-acetaminophen-caffeine (EXCEDRIN MIGRAINE) 250-250-65 MG tablet Take 2 tablets by mouth every 6 (six) hours as needed for headache.     levothyroxine (SYNTHROID) 112 MCG tablet Take 112 mcg by mouth daily.     ALPRAZolam (XANAX) 0.5 MG tablet Take by mouth. (Patient not taking: Reported on 05/09/2021)     Calcium Carb-Cholecalciferol 1000-800 MG-UNIT TABS Take 2 capsules by mouth daily. (Patient not taking: Reported on 05/15/2022)     docusate sodium (COLACE) 100 MG capsule Take  1 capsule (100 mg total) by mouth 2 (two) times daily. (Patient not taking: Reported on 05/09/2021) 30 capsule 0   etodolac (LODINE) 400 MG tablet Take 1 tablet (400 mg total) by mouth 2 (two) times daily. (Patient not taking: Reported on 05/09/2021) 60 tablet 3   levothyroxine (SYNTHROID, LEVOTHROID) 175 MCG tablet Take 150 mcg by mouth daily before breakfast.  (Patient not taking: Reported on 05/09/2021)     Multiple  Vitamins-Minerals (HM MULTIVITAMIN ADULT GUMMY) CHEW Chew 1 Dose by mouth every morning. (Patient not taking: Reported on 05/15/2022)     ondansetron (ZOFRAN ODT) 8 MG disintegrating tablet Take 1 tablet (8 mg total) by mouth every 8 (eight) hours as needed for nausea or vomiting. (Patient not taking: Reported on 05/09/2021) 30 tablet 0   oxyCODONE (ROXICODONE) 5 MG immediate release tablet Take 1-2 tablets (5-10 mg total) by mouth every 4 (four) hours as needed for severe pain. (Patient not taking: Reported on 05/09/2021) 60 tablet 0   predniSONE (DELTASONE) 20 MG tablet Take by mouth. (Patient not taking: Reported on 01/23/2022)     PREMARIN vaginal cream INSERT 1/4 APPLICATORFUL VAGINALLY 2 TIMES A WEEK (Patient not taking: Reported on 05/09/2021) 30 g 2   tiZANidine (ZANAFLEX) 2 MG tablet Take 2 mg by mouth at bedtime as needed for muscle spasms. (Patient not taking: Reported on 05/09/2021)     No current facility-administered medications for this visit.    Allergies:   Codeine, Penicillins, Phenergan [promethazine hcl], Promethazine, Adhesive [tape], and Latex   Social History:  The patient  reports that she has never smoked. She has never used smokeless tobacco. She reports current alcohol use. She reports that she does not use drugs.   Family History:   family history includes Cervical cancer in her sister; Heart attack (age of onset: 35) in her father.    Review of Systems: Review of Systems  Constitutional: Negative.   HENT: Negative.    Respiratory: Negative.    Cardiovascular: Negative.   Gastrointestinal: Negative.   Musculoskeletal: Negative.   Neurological: Negative.   Psychiatric/Behavioral: Negative.    All other systems reviewed and are negative.    PHYSICAL EXAM: VS:  BP 120/66 (BP Location: Right Arm, Patient Position: Sitting, Cuff Size: Normal)   Pulse 64   Ht '5\' 6"'$  (1.676 m)   Wt 129 lb 2 oz (58.6 kg)   SpO2 98%   BMI 20.84 kg/m  , BMI Body mass index is 20.84  kg/m. GEN: Well nourished, well developed, in no acute distress HEENT: normal Neck: no JVD, carotid bruits, or masses Cardiac: RRR; no murmurs, rubs, or gallops,no edema  Respiratory:  clear to auscultation bilaterally, normal work of breathing GI: soft, nontender, nondistended, + BS MS: no deformity or atrophy Skin: warm and dry, no rash Neuro:  Strength and sensation are intact Psych: euthymic mood, full affect   Recent Labs: 03/22/2022: BUN 12; Creatinine, Ser 0.77; Hemoglobin 12.8; Platelets 259; Potassium 4.3; Sodium 136    Lipid Panel No results found for: "CHOL", "HDL", "LDLCALC", "TRIG"    Wt Readings from Last 3 Encounters:  05/15/22 129 lb 2 oz (58.6 kg)  03/22/22 125 lb (56.7 kg)  01/23/22 127 lb 6.4 oz (57.8 kg)       ASSESSMENT AND PLAN:  Problem List Items Addressed This Visit   None Visit Diagnoses     Aortic atherosclerosis ([place])    -  Primary   Coronary artery calcification  SOB (shortness of breath)       Atypical chest pain          Coronary calcification Mild coronary calcification noted on CT scan images Denies symptoms concerning for angina, no further testing needed at this time Non-smoker, no diabetes, history of high cholesterol with management as detailed below  Aortic atherosclerosis Mild aortic atherosclerosis noted on CT scan images No further testing needed, medical management as below  Hyperlipidemia Strong family history coronary disease, father with coronary disease with MI Given mild coronary calcification, aortic atherosclerosis, she has elected to treat cholesterol more aggressively Recommend she start Crestor 5 mg daily Zetia could also be added to achieve goal LDL less than 70  Shortness of breath Developed inflammatory process after RSV vaccine Symptoms improved with antibiotics, steroid, now back to baseline   Total encounter time more than 60 minutes  Greater than 50% was spent in counseling and coordination  of care with the patient    Signed, Esmond Plants, M.D., Ph.D. [place], [place]

## 2022-05-15 NOTE — Patient Instructions (Addendum)
Medication Instructions:  Please start crestor 5 mg daily  If you need a refill on your cardiac medications before your next appointment, please call your pharmacy.   Lab work: No new labs needed  Testing/Procedures: No new testing needed  Follow-Up: At [facility], you and your health needs are our priority.  As part of our continuing mission to provide you with exceptional heart care, we have created designated Provider Care Teams.  These Care Teams include your primary Cardiologist (physician) and Advanced Practice Providers (APPs -  Physician Assistants and Nurse Practitioners) who all work together to provide you with the care you need, when you need it.  You will need a follow up appointment as needed  Providers on your designated Care Team:   Murray Hodgkins, NP Christell Faith, PA-C Cadence Kathlen Mody, [place]  COVID-19 Vaccine Information can be found at: [email or web address] For questions related to vaccine distribution or appointments, please email [email or web address] or call [phone].

## 2022-05-23 DIAGNOSIS — D225 Melanocytic nevi of trunk: Secondary | ICD-10-CM | POA: Diagnosis not present

## 2022-05-23 DIAGNOSIS — L821 Other seborrheic keratosis: Secondary | ICD-10-CM | POA: Diagnosis not present

## 2022-05-23 DIAGNOSIS — D2271 Melanocytic nevi of right lower limb, including hip: Secondary | ICD-10-CM | POA: Diagnosis not present

## 2022-05-23 DIAGNOSIS — D2261 Melanocytic nevi of right upper limb, including shoulder: Secondary | ICD-10-CM | POA: Diagnosis not present

## 2022-05-23 DIAGNOSIS — D2272 Melanocytic nevi of left lower limb, including hip: Secondary | ICD-10-CM | POA: Diagnosis not present

## 2022-05-23 DIAGNOSIS — D2262 Melanocytic nevi of left upper limb, including shoulder: Secondary | ICD-10-CM | POA: Diagnosis not present

## 2022-05-31 DIAGNOSIS — R0602 Shortness of breath: Secondary | ICD-10-CM | POA: Diagnosis not present

## 2022-05-31 DIAGNOSIS — J40 Bronchitis, not specified as acute or chronic: Secondary | ICD-10-CM | POA: Diagnosis not present

## 2022-08-05 DIAGNOSIS — H2513 Age-related nuclear cataract, bilateral: Secondary | ICD-10-CM | POA: Diagnosis not present

## 2022-08-05 DIAGNOSIS — H25011 Cortical age-related cataract, right eye: Secondary | ICD-10-CM | POA: Diagnosis not present

## 2022-08-05 DIAGNOSIS — H43812 Vitreous degeneration, left eye: Secondary | ICD-10-CM | POA: Diagnosis not present

## 2022-08-15 DIAGNOSIS — E039 Hypothyroidism, unspecified: Secondary | ICD-10-CM | POA: Diagnosis not present

## 2022-08-22 DIAGNOSIS — E039 Hypothyroidism, unspecified: Secondary | ICD-10-CM | POA: Diagnosis not present

## 2022-10-30 DIAGNOSIS — E039 Hypothyroidism, unspecified: Secondary | ICD-10-CM | POA: Diagnosis not present

## 2022-10-30 DIAGNOSIS — Z0000 Encounter for general adult medical examination without abnormal findings: Secondary | ICD-10-CM | POA: Diagnosis not present

## 2022-11-06 DIAGNOSIS — E785 Hyperlipidemia, unspecified: Secondary | ICD-10-CM | POA: Diagnosis not present

## 2022-11-06 DIAGNOSIS — E039 Hypothyroidism, unspecified: Secondary | ICD-10-CM | POA: Diagnosis not present

## 2022-11-06 DIAGNOSIS — Z0000 Encounter for general adult medical examination without abnormal findings: Secondary | ICD-10-CM | POA: Diagnosis not present

## 2022-11-06 DIAGNOSIS — Z124 Encounter for screening for malignant neoplasm of cervix: Secondary | ICD-10-CM | POA: Diagnosis not present

## 2022-11-06 DIAGNOSIS — Z0001 Encounter for general adult medical examination with abnormal findings: Secondary | ICD-10-CM | POA: Diagnosis not present

## 2022-11-06 DIAGNOSIS — M810 Age-related osteoporosis without current pathological fracture: Secondary | ICD-10-CM | POA: Diagnosis not present

## 2022-12-04 ENCOUNTER — Other Ambulatory Visit: Payer: Self-pay | Admitting: Internal Medicine

## 2022-12-04 DIAGNOSIS — Z1231 Encounter for screening mammogram for malignant neoplasm of breast: Secondary | ICD-10-CM

## 2022-12-15 ENCOUNTER — Other Ambulatory Visit: Payer: Self-pay | Admitting: Cardiovascular Disease

## 2023-01-06 ENCOUNTER — Ambulatory Visit
Admission: RE | Admit: 2023-01-06 | Discharge: 2023-01-06 | Disposition: A | Discharge status: Home / Self Care | Payer: PPO | Source: Ambulatory Visit | Attending: Internal Medicine | Admitting: Internal Medicine

## 2023-01-06 DIAGNOSIS — Z1231 Encounter for screening mammogram for malignant neoplasm of breast: Secondary | ICD-10-CM | POA: Insufficient documentation

## 2023-01-31 DIAGNOSIS — Z124 Encounter for screening for malignant neoplasm of cervix: Secondary | ICD-10-CM | POA: Diagnosis not present

## 2023-01-31 DIAGNOSIS — R87610 Atypical squamous cells of undetermined significance on cytologic smear of cervix (ASC-US): Secondary | ICD-10-CM | POA: Diagnosis not present

## 2023-01-31 DIAGNOSIS — R87613 High grade squamous intraepithelial lesion on cytologic smear of cervix (HGSIL): Secondary | ICD-10-CM | POA: Diagnosis not present

## 2023-02-20 DIAGNOSIS — E039 Hypothyroidism, unspecified: Secondary | ICD-10-CM | POA: Diagnosis not present

## 2023-02-27 DIAGNOSIS — E039 Hypothyroidism, unspecified: Secondary | ICD-10-CM | POA: Diagnosis not present

## 2023-05-30 DIAGNOSIS — D2261 Melanocytic nevi of right upper limb, including shoulder: Secondary | ICD-10-CM | POA: Diagnosis not present

## 2023-05-30 DIAGNOSIS — D2271 Melanocytic nevi of right lower limb, including hip: Secondary | ICD-10-CM | POA: Diagnosis not present

## 2023-05-30 DIAGNOSIS — D225 Melanocytic nevi of trunk: Secondary | ICD-10-CM | POA: Diagnosis not present

## 2023-05-30 DIAGNOSIS — D2262 Melanocytic nevi of left upper limb, including shoulder: Secondary | ICD-10-CM | POA: Diagnosis not present

## 2023-05-30 DIAGNOSIS — L821 Other seborrheic keratosis: Secondary | ICD-10-CM | POA: Diagnosis not present

## 2023-05-30 DIAGNOSIS — D2272 Melanocytic nevi of left lower limb, including hip: Secondary | ICD-10-CM | POA: Diagnosis not present

## 2023-08-06 DIAGNOSIS — H25011 Cortical age-related cataract, right eye: Secondary | ICD-10-CM | POA: Diagnosis not present

## 2023-08-06 DIAGNOSIS — H2513 Age-related nuclear cataract, bilateral: Secondary | ICD-10-CM | POA: Diagnosis not present

## 2023-08-15 DIAGNOSIS — Z124 Encounter for screening for malignant neoplasm of cervix: Secondary | ICD-10-CM | POA: Diagnosis not present

## 2023-08-15 DIAGNOSIS — R87610 Atypical squamous cells of undetermined significance on cytologic smear of cervix (ASC-US): Secondary | ICD-10-CM | POA: Diagnosis not present

## 2023-08-15 DIAGNOSIS — R87810 Cervical high risk human papillomavirus (HPV) DNA test positive: Secondary | ICD-10-CM | POA: Diagnosis not present

## 2023-08-21 DIAGNOSIS — E039 Hypothyroidism, unspecified: Secondary | ICD-10-CM | POA: Diagnosis not present

## 2023-08-28 DIAGNOSIS — E039 Hypothyroidism, unspecified: Secondary | ICD-10-CM | POA: Diagnosis not present

## 2023-09-22 ENCOUNTER — Emergency Department

## 2023-09-22 ENCOUNTER — Inpatient Hospital Stay
Admission: EM | Admit: 2023-09-22 | Discharge: 2023-09-25 | DRG: 481 | Disposition: A | Discharge status: Home Health | Attending: Internal Medicine | Admitting: Internal Medicine

## 2023-09-22 ENCOUNTER — Encounter: Payer: Self-pay | Admitting: Emergency Medicine

## 2023-09-22 ENCOUNTER — Other Ambulatory Visit: Payer: Self-pay

## 2023-09-22 DIAGNOSIS — Z8049 Family history of malignant neoplasm of other genital organs: Secondary | ICD-10-CM | POA: Diagnosis not present

## 2023-09-22 DIAGNOSIS — Z8249 Family history of ischemic heart disease and other diseases of the circulatory system: Secondary | ICD-10-CM | POA: Diagnosis not present

## 2023-09-22 DIAGNOSIS — E039 Hypothyroidism, unspecified: Secondary | ICD-10-CM | POA: Diagnosis present

## 2023-09-22 DIAGNOSIS — Z79890 Hormone replacement therapy (postmenopausal): Secondary | ICD-10-CM

## 2023-09-22 DIAGNOSIS — Z7982 Long term (current) use of aspirin: Secondary | ICD-10-CM | POA: Diagnosis not present

## 2023-09-22 DIAGNOSIS — Z79899 Other long term (current) drug therapy: Secondary | ICD-10-CM

## 2023-09-22 DIAGNOSIS — E440 Moderate protein-calorie malnutrition: Secondary | ICD-10-CM | POA: Diagnosis not present

## 2023-09-22 DIAGNOSIS — W11XXXA Fall on and from ladder, initial encounter: Secondary | ICD-10-CM | POA: Diagnosis present

## 2023-09-22 DIAGNOSIS — Z8541 Personal history of malignant neoplasm of cervix uteri: Secondary | ICD-10-CM | POA: Diagnosis not present

## 2023-09-22 DIAGNOSIS — Z6820 Body mass index (BMI) 20.0-20.9, adult: Secondary | ICD-10-CM

## 2023-09-22 DIAGNOSIS — I10 Essential (primary) hypertension: Secondary | ICD-10-CM | POA: Diagnosis not present

## 2023-09-22 DIAGNOSIS — Z885 Allergy status to narcotic agent status: Secondary | ICD-10-CM | POA: Diagnosis not present

## 2023-09-22 DIAGNOSIS — R918 Other nonspecific abnormal finding of lung field: Secondary | ICD-10-CM | POA: Diagnosis not present

## 2023-09-22 DIAGNOSIS — Y92009 Unspecified place in unspecified non-institutional (private) residence as the place of occurrence of the external cause: Secondary | ICD-10-CM | POA: Diagnosis not present

## 2023-09-22 DIAGNOSIS — W19XXXA Unspecified fall, initial encounter: Secondary | ICD-10-CM | POA: Diagnosis not present

## 2023-09-22 DIAGNOSIS — Z888 Allergy status to other drugs, medicaments and biological substances status: Secondary | ICD-10-CM | POA: Diagnosis not present

## 2023-09-22 DIAGNOSIS — Z91040 Latex allergy status: Secondary | ICD-10-CM

## 2023-09-22 DIAGNOSIS — R609 Edema, unspecified: Secondary | ICD-10-CM | POA: Diagnosis not present

## 2023-09-22 DIAGNOSIS — Z91048 Other nonmedicinal substance allergy status: Secondary | ICD-10-CM

## 2023-09-22 DIAGNOSIS — Z90710 Acquired absence of both cervix and uterus: Secondary | ICD-10-CM | POA: Diagnosis not present

## 2023-09-22 DIAGNOSIS — S72001A Fracture of unspecified part of neck of right femur, initial encounter for closed fracture: Secondary | ICD-10-CM | POA: Diagnosis not present

## 2023-09-22 DIAGNOSIS — Z043 Encounter for examination and observation following other accident: Secondary | ICD-10-CM | POA: Diagnosis not present

## 2023-09-22 DIAGNOSIS — R600 Localized edema: Secondary | ICD-10-CM | POA: Diagnosis not present

## 2023-09-22 DIAGNOSIS — S72009A Fracture of unspecified part of neck of unspecified femur, initial encounter for closed fracture: Secondary | ICD-10-CM | POA: Diagnosis present

## 2023-09-22 DIAGNOSIS — S72141A Displaced intertrochanteric fracture of right femur, initial encounter for closed fracture: Secondary | ICD-10-CM | POA: Diagnosis not present

## 2023-09-22 DIAGNOSIS — Z880 Allergy status to penicillin: Secondary | ICD-10-CM

## 2023-09-22 DIAGNOSIS — R0689 Other abnormalities of breathing: Secondary | ICD-10-CM | POA: Diagnosis not present

## 2023-09-22 DIAGNOSIS — R001 Bradycardia, unspecified: Secondary | ICD-10-CM | POA: Diagnosis not present

## 2023-09-22 DIAGNOSIS — R9431 Abnormal electrocardiogram [ECG] [EKG]: Secondary | ICD-10-CM | POA: Diagnosis not present

## 2023-09-22 DIAGNOSIS — M160 Bilateral primary osteoarthritis of hip: Secondary | ICD-10-CM | POA: Diagnosis not present

## 2023-09-22 DIAGNOSIS — Z4789 Encounter for other orthopedic aftercare: Secondary | ICD-10-CM | POA: Diagnosis not present

## 2023-09-22 LAB — CBC WITH DIFFERENTIAL/PLATELET
Abs Immature Granulocytes: 0.03 10*3/uL (ref 0.00–0.07)
Basophils Absolute: 0.1 10*3/uL (ref 0.0–0.1)
Basophils Relative: 1 %
Eosinophils Absolute: 0.1 10*3/uL (ref 0.0–0.5)
Eosinophils Relative: 1 %
HCT: 36.9 % (ref 36.0–46.0)
Hemoglobin: 11.9 g/dL — ABNORMAL LOW (ref 12.0–15.0)
Immature Granulocytes: 1 %
Lymphocytes Relative: 36 %
Lymphs Abs: 2.4 10*3/uL (ref 0.7–4.0)
MCH: 31.7 pg (ref 26.0–34.0)
MCHC: 32.2 g/dL (ref 30.0–36.0)
MCV: 98.4 fL (ref 80.0–100.0)
Monocytes Absolute: 0.4 10*3/uL (ref 0.1–1.0)
Monocytes Relative: 6 %
Neutro Abs: 3.6 10*3/uL (ref 1.7–7.7)
Neutrophils Relative %: 55 %
Platelets: 208 10*3/uL (ref 150–400)
RBC: 3.75 MIL/uL — ABNORMAL LOW (ref 3.87–5.11)
RDW: 12.7 % (ref 11.5–15.5)
WBC: 6.6 10*3/uL (ref 4.0–10.5)
nRBC: 0 % (ref 0.0–0.2)

## 2023-09-22 LAB — BASIC METABOLIC PANEL WITH GFR
Anion gap: 9 (ref 5–15)
BUN: 9 mg/dL (ref 8–23)
CO2: 23 mmol/L (ref 22–32)
Calcium: 8.8 mg/dL — ABNORMAL LOW (ref 8.9–10.3)
Chloride: 108 mmol/L (ref 98–111)
Creatinine, Ser: 0.65 mg/dL (ref 0.44–1.00)
GFR, Estimated: >60 mL/min (ref >60)
Glucose, Bld: 120 mg/dL — ABNORMAL HIGH (ref 70–99)
Potassium: 3.6 mmol/L (ref 3.5–5.1)
Sodium: 140 mmol/L (ref 135–145)

## 2023-09-22 MED ORDER — HYDROMORPHONE HCL 1 MG/ML IJ SOLN
0.5000 mg | Freq: Once | INTRAMUSCULAR | Status: AC
  Administered 2023-09-22: 0.5 mg via INTRAVENOUS
  Filled 2023-09-22: qty 0.5

## 2023-09-22 MED ORDER — ACETAMINOPHEN 500 MG PO TABS
1000.0000 mg | ORAL_TABLET | Freq: Three times a day (TID) | ORAL | Status: DC
  Administered 2023-09-22 – 2023-09-25 (×7): 1000 mg via ORAL
  Filled 2023-09-22 (×7): qty 2

## 2023-09-22 MED ORDER — CEFAZOLIN SODIUM-DEXTROSE 2-4 GM/100ML-% IV SOLN
2.0000 g | INTRAVENOUS | Status: AC
  Administered 2023-09-23: 2 g via INTRAVENOUS
  Filled 2023-09-22: qty 100

## 2023-09-22 MED ORDER — METOPROLOL TARTRATE 25 MG PO TABS
12.5000 mg | ORAL_TABLET | Freq: Two times a day (BID) | ORAL | Status: DC
  Filled 2023-09-22 (×2): qty 1

## 2023-09-22 MED ORDER — HYDROMORPHONE HCL 1 MG/ML IJ SOLN
0.5000 mg | INTRAMUSCULAR | Status: DC | PRN
  Administered 2023-09-22 – 2023-09-23 (×5): 0.5 mg via INTRAVENOUS
  Filled 2023-09-22 (×5): qty 0.5

## 2023-09-22 MED ORDER — TRANEXAMIC ACID-NACL 1000-0.7 MG/100ML-% IV SOLN
1000.0000 mg | INTRAVENOUS | Status: AC
  Administered 2023-09-23: 1000 mg via INTRAVENOUS
  Filled 2023-09-22: qty 100

## 2023-09-22 MED ORDER — CELECOXIB 200 MG PO CAPS
200.0000 mg | ORAL_CAPSULE | Freq: Every day | ORAL | Status: DC
  Administered 2023-09-22 – 2023-09-25 (×4): 200 mg via ORAL
  Filled 2023-09-22 (×4): qty 1

## 2023-09-22 MED ORDER — DOCUSATE SODIUM 100 MG PO CAPS
100.0000 mg | ORAL_CAPSULE | Freq: Two times a day (BID) | ORAL | Status: DC | PRN
  Administered 2023-09-24 – 2023-09-25 (×2): 100 mg via ORAL
  Filled 2023-09-22 (×2): qty 1

## 2023-09-22 MED ORDER — ASPIRIN 81 MG PO TBEC
81.0000 mg | DELAYED_RELEASE_TABLET | Freq: Every day | ORAL | Status: DC
  Administered 2023-09-22: 81 mg via ORAL
  Filled 2023-09-22: qty 1

## 2023-09-22 MED ORDER — LEVOTHYROXINE SODIUM 112 MCG PO TABS
112.0000 ug | ORAL_TABLET | Freq: Every day | ORAL | Status: DC
  Administered 2023-09-23 – 2023-09-25 (×3): 112 ug via ORAL
  Filled 2023-09-22 (×3): qty 1

## 2023-09-22 MED ORDER — ENOXAPARIN SODIUM 40 MG/0.4ML IJ SOSY: 40.0000 mg | PREFILLED_SYRINGE | INTRAMUSCULAR | Status: DC

## 2023-09-22 MED ORDER — ROSUVASTATIN CALCIUM 5 MG PO TABS
5.0000 mg | ORAL_TABLET | Freq: Every day | ORAL | Status: DC
  Administered 2023-09-22: 5 mg via ORAL
  Filled 2023-09-22 (×3): qty 1

## 2023-09-22 MED ORDER — OXYCODONE HCL 5 MG PO TABS
5.0000 mg | ORAL_TABLET | ORAL | Status: DC | PRN
  Administered 2023-09-23: 5 mg via ORAL
  Filled 2023-09-22 (×2): qty 1

## 2023-09-22 MED ORDER — ONDANSETRON HCL 4 MG/2ML IJ SOLN: 4.0000 mg | Freq: Once | INTRAMUSCULAR | Status: DC

## 2023-09-22 MED ORDER — HYDROCODONE-ACETAMINOPHEN 5-325 MG PO TABS: 1.0000 | ORAL_TABLET | Freq: Four times a day (QID) | ORAL | Status: DC | PRN

## 2023-09-22 NOTE — ED Triage Notes (Signed)
 Pt arrives via [facility] with reports of falling off the bottom stair at her house. Pt reports right hip pain. Pt hypotensive upon EMS arrival.  Ems meds PTA -4mg  zofran  - NaCL -100 mcg fentanyl 

## 2023-09-22 NOTE — ED Provider Notes (Signed)
 [facility] Provider Note    Event Date/Time   First MD Initiated Contact with Patient 09/22/23 1307     (approximate)   History   Fall   HPI  Cathy Hunt is a 73 y.o. female not on any blood thinners who comes in with concerns for fall.  Patient reports mechanical fall when she slipped landing directly on her right hip.  She did not hit her head did not lose consciousness no chest pain shortness of breath before the fall.  She only reports pain in her right hip   Physical Exam   Triage Vital Signs: ED Triage Vitals [09/22/23 1303]  Encounter Vitals Group     BP 122/73     Girls Systolic BP Percentile      Girls Diastolic BP Percentile      Boys Systolic BP Percentile      Boys Diastolic BP Percentile      Pulse Rate 83     Resp 15     Temp 97.9 F (36.6 C)     Temp Source Oral     SpO2 99 %     Weight      Height      Head Circumference      Peak Flow      Pain Score 10     Pain Loc      Pain Education      Exclude from Growth Chart     Most recent vital signs: Vitals:   09/22/23 1303  BP: 122/73  Pulse: 83  Resp: 15  Temp: 97.9 F (36.6 C)  SpO2: 99%     General: Awake, no distress.  CV:  Good peripheral perfusion.  Resp:  Normal effort.  Abd:  No distention.  Other:  No trauma to the head noted.  No C-spine tenderness full range of motion of neck.  No numbness no tingling.  She has pain of the right hip.  Good distal pulse.  Chest wall soft and nontender abdomen soft nontender Good distal pulse in the foot  ED Results / Procedures / Treatments   Labs (all labs ordered are listed, but only abnormal results are displayed) Labs Reviewed  CBC WITH DIFFERENTIAL/PLATELET - Abnormal; Notable for the following components:      Result Value   RBC 3.75 (*)    Hemoglobin 11.9 (*)    All other components within normal limits  BASIC METABOLIC PANEL WITH GFR - Abnormal; Notable for the following components:   Glucose, Bld 120  (*)    Calcium  8.8 (*)    All other components within normal limits      RADIOLOGY I have reviewed the xray personally and interpreted positive fracture   PROCEDURES:  Critical Care performed: No  Procedures   MEDICATIONS ORDERED IN ED: Medications  ondansetron  (ZOFRAN ) injection 4 mg (0 mg Intravenous Hold 09/22/23 1319)  HYDROmorphone  (DILAUDID ) injection 0.5 mg (0.5 mg Intravenous Given 09/22/23 1311)  HYDROmorphone  (DILAUDID ) injection 0.5 mg (0.5 mg Intravenous Given 09/22/23 1343)     IMPRESSION / MDM / ASSESSMENT AND PLAN / ED COURSE  I reviewed the triage vital signs and the nursing notes.   Patient's presentation is most consistent with acute presentation with potential threat to life or bodily function.   Patient comes in with mechanical fall onto her right hip no signs of head trauma no C-spine tenderness or concern for injuries to her neck.  Patient given some Dilaudid  to help with pain.  X-ray confirms fracture.  Did discuss with family.  Sent a message to Dr. Janell Media from ortho who is aware.  Will discuss possible team for admission.  BMP reassuring CBC reassuring  I reviewed an office note from 08/28/2023 where patient was seen for acquired hypothyroidism  The patient is on the cardiac monitor to evaluate for evidence of arrhythmia and/or significant heart rate changes.      FINAL CLINICAL IMPRESSION(S) / ED DIAGNOSES   Final diagnoses:  Closed fracture of right hip, initial encounter [facility])  Fall, initial encounter     Rx / DC Orders   ED Discharge Orders     None        Note:  This document was prepared using Dragon voice recognition software and may include unintentional dictation errors.   Lubertha Rush, MD 09/22/23 [phone]

## 2023-09-22 NOTE — H&P (Signed)
 History and Physical    Cathy Hunt ZOX:[id number] DOB: Aug 12, 1950 DOA: 09/22/2023  PCP: Little Riff, MD (Confirm with patient/family/NH records and if not entered, this has to be entered at [facility] point of entry) Patient coming from: Home  I have personally briefly reviewed patient's old medical records in [facility]  Chief Complaint: I fell and broke my hip  HPI: Cathy Hunt is a 73 y.o. female with medical history significant of hypothyroidism, cervical CA, presented with mechanical fall and right hip pain.  Patient was descending from a ladder at home missed the last step and fell on the right hip.  With excruciating pain she could not stand up and walk.  Denied any head neck injury no LOC.  No prodrome of lightheadedness palpitations before during the event.  ED Course: Afebrile, no tachycardia no hypotension not hypoxic.  Hip x-ray showed intertrochanteric fracture of right femoral neck as well as proximal shaft fracture.  Review of Systems: As per HPI otherwise 14 point review of systems negative.    Past Medical History:  Diagnosis Date   Arthritis    Cancer (HCC)    Cervical   Complication of anesthesia    difficulty following verbal commands after vein ligation surgery   Dental crowns present    a lot   Headache    migraines   Hypothyroidism    Insomnia    PONV (postoperative nausea and vomiting)     Past Surgical History:  Procedure Laterality Date   APPENDECTOMY     BILATERAL SALPINGECTOMY  08/21/2015   Procedure: BILATERAL SALPINGECTOMY;  Surgeon: Joselyn Nicely Schermerhorn, MD;  Location: [facility] ORS;  Service: Gynecology;;   BREAST EXCISIONAL BIOPSY Right 2008   neg   BREAST SURGERY Right    Lumpectomy   CERVICAL CONE BIOPSY  1984   COLONOSCOPY WITH PROPOFOL  N/A 03/23/2015   Procedure: COLONOSCOPY WITH PROPOFOL ;  Surgeon: Stephens Eis, MD;  Location: [facility] ENDOSCOPY;  Service: Gastroenterology;  Laterality: N/A;   DIAGNOSTIC LAPAROSCOPY     SHOULDER  ARTHROSCOPY Left 12/13/2015   Procedure: arthroscopic debridement, arthroscopic subacromial decompression, and mini-open rotator cuff repair, left shoulder;  Surgeon: Elner Hahn, MD;  Location: [facility];  Service: Orthopedics;  Laterality: Left;  Patien requests to be first   TONSILLECTOMY     TUBAL LIGATION     VAGINAL HYSTERECTOMY N/A 08/21/2015   Procedure: HYSTERECTOMY VAGINAL;  Surgeon: Carolynn Citrin, MD;  Location: [facility] ORS;  Service: Gynecology;  Laterality: N/A;   VAGINAL HYSTERECTOMY     VEIN LIGATION AND STRIPPING  1985     reports that she has never smoked. She has never used smokeless tobacco. She reports current alcohol  use. She reports that she does not use drugs.  Allergies  Allergen Reactions   Codeine Other (See Comments)    drop in blood pressure   Penicillins Swelling   Phenergan [Promethazine Hcl] Other (See Comments)    drop in blood pressure   Promethazine Other (See Comments)    Low BP   Adhesive [Tape] Rash   Latex Rash    Family History  Problem Relation Age of Onset   Heart attack Father 47   Cervical cancer Sister    Breast cancer Neg Hx     Prior to Admission medications   Medication Sig Start Date End Date Taking? Authorizing Provider  ALPRAZolam (XANAX) 0.5 MG tablet Take by mouth. Patient not taking: Reported on 05/09/2021 08/09/20   [provider]  aspirin EC 81 MG tablet Take 81 mg by mouth daily.    [provider]  aspirin-acetaminophen -caffeine (EXCEDRIN MIGRAINE) 250-250-65 MG tablet Take 2 tablets by mouth every 6 (six) hours as needed for headache.    [provider]  Calcium  Carb-Cholecalciferol 1000-800 MG-UNIT TABS Take 2 capsules by mouth daily. Patient not taking: Reported on 05/15/2022    [provider]  docusate sodium  (COLACE) 100 MG capsule Take 1 capsule (100 mg total) by mouth 2 (two) times daily. Patient not taking: Reported on 05/09/2021 08/22/15   Schermerhorn, Joselyn Nicely, MD   etodolac  (LODINE ) 400 MG tablet Take 1 tablet (400 mg total) by mouth 2 (two) times daily. Patient not taking: Reported on 05/09/2021 12/13/15   Poggi, John J, MD  levothyroxine (SYNTHROID) 112 MCG tablet Take 112 mcg by mouth daily. 08/05/20   [provider]  levothyroxine (SYNTHROID, LEVOTHROID) 175 MCG tablet Take 150 mcg by mouth daily before breakfast.  Patient not taking: Reported on 05/09/2021    [provider]  Multiple Vitamins-Minerals (HM MULTIVITAMIN ADULT GUMMY) CHEW Chew 1 Dose by mouth every morning. Patient not taking: Reported on 05/15/2022    [provider]  ondansetron  (ZOFRAN  ODT) 8 MG disintegrating tablet Take 1 tablet (8 mg total) by mouth every 8 (eight) hours as needed for nausea or vomiting. Patient not taking: Reported on 05/09/2021 08/22/15   Schermerhorn, Joselyn Nicely, MD  oxyCODONE  (ROXICODONE ) 5 MG immediate release tablet Take 1-2 tablets (5-10 mg total) by mouth every 4 (four) hours as needed for severe pain. Patient not taking: Reported on 05/09/2021 12/13/15   Poggi, Kaylene Pascal, MD  predniSONE (DELTASONE) 20 MG tablet Take by mouth. Patient not taking: Reported on 01/23/2022 04/30/21   [provider]  PREMARIN  vaginal cream INSERT 1/4 APPLICATORFUL VAGINALLY 2 TIMES A WEEK Patient not taking: Reported on 05/09/2021 05/18/20   Nelda Balsam, NP  rosuvastatin  (CRESTOR ) 5 MG tablet Take 1 tablet (5 mg total) by mouth daily. 05/15/22   Gollan, Timothy J, MD  tiZANidine (ZANAFLEX) 2 MG tablet Take 2 mg by mouth at bedtime as needed for muscle spasms. Patient not taking: Reported on 05/09/2021    [provider]    Physical Exam: Vitals:   09/22/23 1303  BP: 122/73  Pulse: 83  Resp: 15  Temp: 97.9 F (36.6 C)  TempSrc: Oral  SpO2: 99%    Constitutional: NAD, calm, comfortable Vitals:   09/22/23 1303  BP: 122/73  Pulse: 83  Resp: 15  Temp: 97.9 F (36.6 C)  TempSrc: Oral  SpO2: 99%   Eyes: PERRL, lids and conjunctivae  normal ENMT: Mucous membranes are moist. Posterior pharynx clear of any exudate or lesions.Normal dentition.  Neck: normal, supple, no masses, no thyromegaly Respiratory: clear to auscultation bilaterally, no wheezing, no crackles. Normal respiratory effort. No accessory muscle use.  Cardiovascular: Regular rate and rhythm, no murmurs / rubs / gallops. No extremity edema. 2+ pedal pulses. No carotid bruits.  Abdomen: no tenderness, no masses palpated. No hepatosplenomegaly. Bowel sounds positive.  Musculoskeletal: Right leg rotated and shortened Skin: no rashes, lesions, ulcers. No induration Neurologic: CN 2-12 grossly intact. Sensation intact, DTR normal. Strength 5/5 in all 4.  Psychiatric: Normal judgment and insight. Alert and oriented x 3. Normal mood.    Labs on Admission: I have personally reviewed following labs and imaging studies  CBC: Recent Labs  Lab 09/22/23 1308  WBC 6.6  NEUTROABS 3.6  HGB 11.9*  HCT 36.9  MCV 98.4  PLT 208   Basic Metabolic Panel: Recent Labs  Lab 09/22/23 1308  NA 140  K 3.6  CL 108  CO2 23  GLUCOSE 120*  BUN 9  CREATININE 0.65  CALCIUM  8.8*   GFR: CrCl cannot be calculated (Unknown ideal weight.). Liver Function Tests: No results for input(s): AST, ALT, ALKPHOS, BILITOT, PROT, ALBUMIN in the last 168 hours. No results for input(s): LIPASE, AMYLASE in the last 168 hours. No results for input(s): AMMONIA in the last 168 hours. Coagulation Profile: No results for input(s): INR, PROTIME in the last 168 hours. Cardiac Enzymes: No results for input(s): CKTOTAL, CKMB, CKMBINDEX, TROPONINI in the last 168 hours. BNP (last 3 results) No results for input(s): PROBNP in the last 8760 hours. HbA1C: No results for input(s): HGBA1C in the last 72 hours. CBG: No results for input(s): GLUCAP in the last 168 hours. Lipid Profile: No results for input(s): CHOL, HDL, LDLCALC, TRIG, CHOLHDL,  LDLDIRECT in the last 72 hours. Thyroid  Function Tests: No results for input(s): TSH, T4TOTAL, FREET4, T3FREE, THYROIDAB in the last 72 hours. Anemia Panel: No results for input(s): VITAMINB12, FOLATE, FERRITIN, TIBC, IRON, RETICCTPCT in the last 72 hours. Urine analysis: No results found for: COLORURINE, APPEARANCEUR, LABSPEC, PHURINE, GLUCOSEU, HGBUR, BILIRUBINUR, KETONESUR, PROTEINUR, UROBILINOGEN, NITRITE, LEUKOCYTESUR  Radiological Exams on Admission: DG Chest Portable 1 View Result Date: 09/22/2023 CLINICAL DATA:  Fall EXAM: PORTABLE CHEST 1 VIEW COMPARISON:  03/22/2022 FINDINGS: Limited study as the patient was imaged in the left lateral decubitus position. This causes volume loss with areas of peripheral opacities in the left lung, likely atelectasis. Right lung clear. Heart mediastinal contours within normal limits. No visible pneumothorax or acute bony abnormality. IMPRESSION: Limited study performed in the left lateral decubitus position. Areas of atelectasis in the left lung likely related to positioning. No definite active process. Electronically Signed   By: Janeece Mechanic M.D.   On: 09/22/2023 14:24   DG Hip Unilat W or Wo Pelvis 2-3 Views Right Result Date: 09/22/2023 CLINICAL DATA:  Fall, right hip pain EXAM: DG HIP (WITH OR WITHOUT PELVIS) 2-3V RIGHT COMPARISON:  None Available. FINDINGS: There is a right femoral intertrochanteric fracture with varus angulation and impaction. No subluxation or dislocation. Mild degenerative changes in the hips. SI joints symmetric and unremarkable. IMPRESSION: Right femoral intertrochanteric fracture with varus angulation and impaction. Electronically Signed   By: Janeece Mechanic M.D.   On: 09/22/2023 14:23    EKG: [place]  Assessment/Plan Principal Problem:   Hip fracture (HCC) Active Problems:   Hip fracture, unspecified laterality, closed, initial encounter (HCC)  (please populate well all  problems here in Problem List. (For example, if patient is on BP meds at home and you resume or decide to hold them, it is a problem that needs to be her. Same for CAD, COPD, HLD and so on)  Right femur intertrochanteric comminuted fracture - Will need ORIF.  Orthopedic surgery messaged - Pain control, n.p.o. for now - Check vitamin D level - Continue aspirin, start low-dose of beta-blocker - Patient is active at baseline, denied any shortness of breath chest pain with daily activity.  Tolerated 4 METS of activity.  Medically cleared for incoming ORIF with generalized anesthesia.  Hypothyroidism - Continue Synthroid  DVT prophylaxis: Lovenox Code Status: Full code Family Communication: Daughter at bedside Disposition Plan: Patient is sick with hip fracture requiring ORIF, expect more than 2 midnight hospital stay Consults called: Orthopedic surgery Admission status: MedSurg admission   Unnamed Hino  Patt Boozer MD [facility] Pager [phone]  09/22/2023, 2:27 PM

## 2023-09-22 NOTE — Progress Notes (Signed)
 Contacted Cathy Hunt via telephone to discuss her injury and plan for surgery.  She has a comminuted right intertrochanteric femur fracture.  She continues to have pain in the hip.  She was previously very active and hopes to return to her previous level of activity.  We will plan to proceed to surgery tomorrow for operative fixation of right intertrochanteric femur fracture.  NPO at midnight.  All questions have been answered.  Full consult note to follow     Verenise Moulin A. Ernesta Heading, MD MS [place] [facility] [address] [place],  [place]  [address] Phone: [phone] Fax: [phone]

## 2023-09-23 ENCOUNTER — Encounter: Payer: Self-pay | Admitting: Internal Medicine

## 2023-09-23 ENCOUNTER — Encounter
Admission: EM | Disposition: A | Discharge status: Home Health | Payer: Self-pay | Source: Home / Self Care | Attending: Internal Medicine

## 2023-09-23 ENCOUNTER — Inpatient Hospital Stay

## 2023-09-23 ENCOUNTER — Inpatient Hospital Stay: Admitting: Registered Nurse

## 2023-09-23 ENCOUNTER — Other Ambulatory Visit: Payer: Self-pay

## 2023-09-23 DIAGNOSIS — E440 Moderate protein-calorie malnutrition: Secondary | ICD-10-CM | POA: Insufficient documentation

## 2023-09-23 DIAGNOSIS — S72001A Fracture of unspecified part of neck of right femur, initial encounter for closed fracture: Secondary | ICD-10-CM | POA: Diagnosis not present

## 2023-09-23 HISTORY — PX: INTRAMEDULLARY (IM) NAIL INTERTROCHANTERIC: SHX5875

## 2023-09-23 LAB — BASIC METABOLIC PANEL WITH GFR
Anion gap: 7 (ref 5–15)
BUN: 15 mg/dL (ref 8–23)
CO2: 26 mmol/L (ref 22–32)
Calcium: 8.8 mg/dL — ABNORMAL LOW (ref 8.9–10.3)
Chloride: 101 mmol/L (ref 98–111)
Creatinine, Ser: 0.6 mg/dL (ref 0.44–1.00)
GFR, Estimated: >60 mL/min (ref >60)
Glucose, Bld: 155 mg/dL — ABNORMAL HIGH (ref 70–99)
Potassium: 4 mmol/L (ref 3.5–5.1)
Sodium: 134 mmol/L — ABNORMAL LOW (ref 135–145)

## 2023-09-23 LAB — CBC
HCT: 33.6 % — ABNORMAL LOW (ref 36.0–46.0)
Hemoglobin: 11.2 g/dL — ABNORMAL LOW (ref 12.0–15.0)
MCH: 32.5 pg (ref 26.0–34.0)
MCHC: 33.3 g/dL (ref 30.0–36.0)
MCV: 97.4 fL (ref 80.0–100.0)
Platelets: 199 10*3/uL (ref 150–400)
RBC: 3.45 MIL/uL — ABNORMAL LOW (ref 3.87–5.11)
RDW: 12.6 % (ref 11.5–15.5)
WBC: 13.8 10*3/uL — ABNORMAL HIGH (ref 4.0–10.5)
nRBC: 0 % (ref 0.0–0.2)

## 2023-09-23 LAB — VITAMIN D 25 HYDROXY (VIT D DEFICIENCY, FRACTURES): Vit D, 25-Hydroxy: 38.09 ng/mL (ref 30–100)

## 2023-09-23 SURGERY — FIXATION, FRACTURE, INTERTROCHANTERIC, WITH INTRAMEDULLARY ROD
Anesthesia: General | Site: Leg Upper | Laterality: Right

## 2023-09-23 MED ORDER — DEXAMETHASONE SODIUM PHOSPHATE 10 MG/ML IJ SOLN
INTRAMUSCULAR | Status: AC
  Filled 2023-09-23: qty 1

## 2023-09-23 MED ORDER — DEXAMETHASONE SODIUM PHOSPHATE 10 MG/ML IJ SOLN
INTRAMUSCULAR | Status: DC | PRN
  Administered 2023-09-23: 10 mg via INTRAVENOUS

## 2023-09-23 MED ORDER — PROPOFOL 10 MG/ML IV BOLUS
INTRAVENOUS | Status: AC
Start: 2023-09-23 — End: 2023-09-23
  Filled 2023-09-23: qty 20

## 2023-09-23 MED ORDER — FENTANYL CITRATE (PF) 100 MCG/2ML IJ SOLN
INTRAMUSCULAR | Status: AC
Start: 2023-09-23 — End: 2023-09-23
  Filled 2023-09-23: qty 2

## 2023-09-23 MED ORDER — SUGAMMADEX SODIUM 200 MG/2ML IV SOLN
INTRAVENOUS | Status: DC | PRN
  Administered 2023-09-23: 200 mg via INTRAVENOUS

## 2023-09-23 MED ORDER — LIDOCAINE HCL (PF) 2 % IJ SOLN
INTRAMUSCULAR | Status: AC
Start: 2023-09-23 — End: 2023-09-23
  Filled 2023-09-23: qty 5

## 2023-09-23 MED ORDER — ACETAMINOPHEN 10 MG/ML IV SOLN
INTRAVENOUS | Status: DC | PRN
  Administered 2023-09-23: 1000 mg via INTRAVENOUS

## 2023-09-23 MED ORDER — ONDANSETRON HCL 4 MG/2ML IJ SOLN
INTRAMUSCULAR | Status: AC
Start: 2023-09-23 — End: 2023-09-23
  Filled 2023-09-23: qty 2

## 2023-09-23 MED ORDER — SODIUM CHLORIDE 0.9 % IR SOLN
Status: DC | PRN
  Administered 2023-09-23: 500 mL

## 2023-09-23 MED ORDER — FENTANYL CITRATE (PF) 100 MCG/2ML IJ SOLN
25.0000 ug | INTRAMUSCULAR | Status: DC | PRN
  Administered 2023-09-23: 25 ug via INTRAVENOUS
  Administered 2023-09-23: 50 ug via INTRAVENOUS

## 2023-09-23 MED ORDER — ENSURE PLUS HIGH PROTEIN PO LIQD
237.0000 mL | Freq: Three times a day (TID) | ORAL | Status: DC
  Administered 2023-09-24 – 2023-09-25 (×4): 237 mL via ORAL

## 2023-09-23 MED ORDER — PROPOFOL 10 MG/ML IV BOLUS
INTRAVENOUS | Status: DC | PRN
  Administered 2023-09-23: 100 mg via INTRAVENOUS
  Administered 2023-09-23: 30 mg via INTRAVENOUS

## 2023-09-23 MED ORDER — LACTATED RINGERS IV SOLN: INTRAVENOUS | Status: DC | PRN

## 2023-09-23 MED ORDER — CHLORHEXIDINE GLUCONATE CLOTH 2 % EX PADS: 6.0000 | MEDICATED_PAD | Freq: Every day | CUTANEOUS | Status: DC

## 2023-09-23 MED ORDER — PROPOFOL 1000 MG/100ML IV EMUL
INTRAVENOUS | Status: AC
Start: 2023-09-23 — End: 2023-09-23
  Filled 2023-09-23: qty 100

## 2023-09-23 MED ORDER — ACETAMINOPHEN 10 MG/ML IV SOLN
INTRAVENOUS | Status: AC
  Filled 2023-09-23: qty 100

## 2023-09-23 MED ORDER — PHENYLEPHRINE HCL-NACL 20-0.9 MG/250ML-% IV SOLN
INTRAVENOUS | Status: DC | PRN
  Administered 2023-09-23: 20 ug/min via INTRAVENOUS

## 2023-09-23 MED ORDER — ADULT MULTIVITAMIN W/MINERALS CH
1.0000 | ORAL_TABLET | Freq: Every day | ORAL | Status: DC
  Administered 2023-09-24: 1
  Filled 2023-09-23: qty 1

## 2023-09-23 MED ORDER — PROPOFOL 500 MG/50ML IV EMUL
INTRAVENOUS | Status: DC | PRN
  Administered 2023-09-23: 150 ug/kg/min via INTRAVENOUS

## 2023-09-23 MED ORDER — FENTANYL CITRATE (PF) 100 MCG/2ML IJ SOLN
INTRAMUSCULAR | Status: DC | PRN
  Administered 2023-09-23: 50 ug via INTRAVENOUS

## 2023-09-23 MED ORDER — ROCURONIUM BROMIDE 100 MG/10ML IV SOLN
INTRAVENOUS | Status: DC | PRN
  Administered 2023-09-23: 50 mg via INTRAVENOUS
  Administered 2023-09-23: 10 mg via INTRAVENOUS

## 2023-09-23 MED ORDER — ROSUVASTATIN CALCIUM 5 MG PO TABS
5.0000 mg | ORAL_TABLET | Freq: Every day | ORAL | Status: DC
  Filled 2023-09-23: qty 1

## 2023-09-23 MED ORDER — PHENYLEPHRINE HCL-NACL 20-0.9 MG/250ML-% IV SOLN
INTRAVENOUS | Status: AC
Start: 2023-09-23 — End: 2023-09-23
  Filled 2023-09-23: qty 250

## 2023-09-23 MED ORDER — CEFAZOLIN SODIUM-DEXTROSE 2-4 GM/100ML-% IV SOLN
2.0000 g | Freq: Three times a day (TID) | INTRAVENOUS | Status: AC
  Administered 2023-09-23 – 2023-09-24 (×3): 2 g via INTRAVENOUS
  Filled 2023-09-23 (×3): qty 100

## 2023-09-23 MED ORDER — DROPERIDOL 2.5 MG/ML IJ SOLN: 0.6250 mg | Freq: Once | INTRAMUSCULAR | Status: DC | PRN

## 2023-09-23 MED ORDER — GLYCOPYRROLATE 0.2 MG/ML IJ SOLN
INTRAMUSCULAR | Status: AC
Start: 2023-09-23 — End: 2023-09-23
  Filled 2023-09-23: qty 1

## 2023-09-23 MED ORDER — BUPIVACAINE HCL (PF) 0.5 % IJ SOLN
INTRAMUSCULAR | Status: AC
  Filled 2023-09-23: qty 10

## 2023-09-23 MED ORDER — BUPIVACAINE HCL (PF) 0.5 % IJ SOLN
INTRAMUSCULAR | Status: DC | PRN
  Administered 2023-09-23: 30 mL

## 2023-09-23 MED ORDER — LIDOCAINE HCL (CARDIAC) PF 100 MG/5ML IV SOSY
PREFILLED_SYRINGE | INTRAVENOUS | Status: DC | PRN
  Administered 2023-09-23: 60 mg via INTRAVENOUS

## 2023-09-23 SURGICAL SUPPLY — 34 items
BIT DRILL 4.0X280 (BIT) IMPLANT
BNDG COHESIVE 6X5 TAN ST LF (GAUZE/BANDAGES/DRESSINGS) ×1 IMPLANT
BRUSH SCRUB EZ 4% CHG (MISCELLANEOUS) ×1 IMPLANT
CHLORAPREP W/TINT 26 (MISCELLANEOUS) ×1 IMPLANT
DRAPE C-ARM XRAY 36X54 (DRAPES) ×1 IMPLANT
DRAPE C-ARMOR (DRAPES) IMPLANT
DRAPE INCISE IOBAN 66X60 STRL (DRAPES) ×1 IMPLANT
DRSG TEGADERM 4X4.75 (GAUZE/BANDAGES/DRESSINGS) ×4 IMPLANT
ELECTRODE REM PT RTRN 9FT ADLT (ELECTROSURGICAL) ×1 IMPLANT
GAUZE SPONGE 4X4 12PLY STRL (GAUZE/BANDAGES/DRESSINGS) ×1 IMPLANT
GLOVE BIOGEL PI IND STRL 8 (GLOVE) ×1 IMPLANT
GLOVE SRG 8 PF TXTR STRL LF DI (GLOVE) ×1 IMPLANT
GOWN STRL REUS W/ TWL LRG LVL3 (GOWN DISPOSABLE) ×1 IMPLANT
GOWN STRL REUS W/ TWL XL LVL3 (GOWN DISPOSABLE) ×1 IMPLANT
GUIDEWIRE ORTH 900X3XBALL NOSE (WIRE) IMPLANT
KIT PATIENT CARE HANA TABLE (KITS) ×1 IMPLANT
KIT TURNOVER CYSTO (KITS) ×1 IMPLANT
MANIFOLD NEPTUNE II (INSTRUMENTS) ×1 IMPLANT
MAT ABSORB FLUID 56X50 GRAY (MISCELLANEOUS) ×1 IMPLANT
NAIL IM TROCH 10X36 125D RT (Nail) IMPLANT
NDL HYPO 21X1.5 SAFETY (NEEDLE) ×1 IMPLANT
NEEDLE HYPO 21X1.5 SAFETY (NEEDLE) ×1 IMPLANT
NS IRRIG 1000ML POUR BTL (IV SOLUTION) ×1 IMPLANT
PACK HIP COMPR (MISCELLANEOUS) ×1 IMPLANT
PENCIL SMOKE EVACUATOR COATED (MISCELLANEOUS) IMPLANT
PIN GUIDE THRD AR 3.2X330 (PIN) IMPLANT
SCREW CORT CAPT FT 5.0X36 (Screw) IMPLANT
SCREW TELESCOP LAG 10.5X100 (Screw) IMPLANT
STRIP CLOSURE SKIN 1/2X4 (GAUZE/BANDAGES/DRESSINGS) IMPLANT
SUT MNCRL AB 4-0 PS2 18 (SUTURE) IMPLANT
SUT MON AB 2-0 CT1 36 (SUTURE) ×1 IMPLANT
SUT VIC AB 0 CT1 36 (SUTURE) IMPLANT
SYR 30ML LL (SYRINGE) ×1 IMPLANT
TRAY FOLEY MTR SLVR 16FR STAT (SET/KITS/TRAYS/PACK) ×1 IMPLANT

## 2023-09-23 NOTE — Op Note (Signed)
 Orthopaedic Surgery Operative Note (CSN: [id number])  Cathy Hunt  01-Mar-1951 Date of Surgery: 09/23/2023   Diagnoses:  Right intertrochanteric femur fracture  Procedure: Cephalomedullary nail for Right intertrochanteric femur fracture   Operative Finding Successful completion of the planned procedure.  Placement of 360 mm x 10 mm x 125 degree nail.   Post-Op Diagnosis: Same Surgeons:Primary: Tonita Frater, MD Assistants:  Location: [facility] OR ROOM 03 Anesthesia: General with local anesthesia Antibiotics: Ancef 2 g Tourniquet time: N/A Estimated Blood Loss: 250 cc Complications: None Specimens: None  Implants: Implant Name Type Inv. Item Serial No. Manufacturer Lot No. LRB No. Used Action  SCREW TELESCOP LAG 10.5X100 - SNA Screw SCREW TELESCOP LAG 10.5X100 NA [id number] Right 1 Implanted  ES Tochanteric Nail, Right, 10 mm x 36 cm x 125 degree    [employer] [id number] Right 1 Implanted    Indications for Surgery:   Cathy Hunt is a 73 y.o. female who had a mechanical fall and sustained a Right intertrochanteric femur fracture, with reverse obliquity component.  I recommended operative fixation to restore stability and allow the patient to ambulate immediately postop.  Benefits and risks of operative and nonoperative management were discussed prior to surgery with the patient and informed consent form was completed.  Specific risks including infection, need for additional surgery, persistent pain, bleeding, malunion, nonunion and more severe complications associated with anesthesia.  The patient/guardian elected to proceed and surgical consent was obtained.    Procedure:   The patient was identified properly. Informed consent was obtained and the surgical site was marked. The patient was taken to the OR where general anesthesia was induced.   The patient was placed supine on a fracture table and appropriate reduction was obtained and visualized on fluoroscopy prior to the  beginning of the procedure.  Timeout was performed before the beginning of the case.  Ancef 2 g dosing was confirmed prior to making incision.  The patient received TXA prior to the start of surgery.   We made an incision proximal to the greater trochanter and dissected down through the fascia.  We then carefully placed a guidepin, localizing under fluoroscopy.  Once satisfied with the starting point, the entry reamer was used to gain entry into the intramedullary canal.  A ball tip guidewire was then introduced and passed to an appropriate level at the physeal scar of the distal femur and measurement was obtained proximally using fluoroscopy.  We selected the appropriate length of nail, as noted above.  Entry reamer was used needed but the nail was unreamed.  At this point we placed our nail localizing under fluoroscopy, and confirmed that it was at the appropriate level.  The lateral spike was distracted laterally, but we were able to improve the overall alignment by manipulating the leg and using a bone hook. Next we used the outrigger device to pass a wire into the femoral neck, and then the cephalomedullary lag screw.  The screw was locked proximally to avoid over collapse.  We then turned our attention to the distal interlocking screw.  Once again, we used the outrigger device to place a single interlocking screw in the midshaft area.  The outrigger device was removed and final fluoroscopic images were obtained.  The wounds were thoroughly irrigated closed in a multilayer fashion with 2-0 monocryl and staples.  Sterile dressings were placed.  The patient was awoken from general anesthesia and taken to the PACU in stable condition without complication.  Post-operative plan:  Weightbearing: The patient will be WBAT on the operative extremity.   DVT prophylaxis per primary team, no orthopedic contraindications.  Recommend 81 mg Aspirin BID, unless patient cannot tolerate or was previously on  anticoagulation.  Prefer to start Ppx POD#1 Pain control with PRN pain medication preferring oral medicines.   Dressing can be reinforced as needed, will change on POD#2/3 if needed.  Patient does not need to remain hospitalized for dressing change Follow up plan: approximately 2 weeks postop for incision check and XR.  If the patient will be returning to a nursing facility, staples can be removed around this time and a follow up appointment can be scheduled for 6 weeks after surgery. XR at next visit:  please obtain AP pelvis, and 2 views of the Right hip   Please schedule a follow up in 2 weeks at the [place] [facility].  Please call [phone] and specifically request an appointment in [place].  If there are any issues, I can be contacted at the office in [place] [phone]

## 2023-09-23 NOTE — Transfer of Care (Signed)
 Immediate Anesthesia Transfer of Care Note  Patient: Cathy Hunt  Procedure(s) Performed: FIXATION, FRACTURE, INTERTROCHANTERIC, WITH INTRAMEDULLARY ROD (Right: Leg Upper)  Patient Location: PACU  Anesthesia Type:General  Level of Consciousness: awake, drowsy, and patient cooperative  Airway & Oxygen Therapy: Patient Spontanous Breathing and Patient connected to face mask oxygen  Post-op Assessment: Report given to RN and Post -op Vital signs reviewed and stable  Post vital signs: Reviewed and stable  Last Vitals:  Vitals Value Taken Time  BP 132/96 09/23/23 15:45  Temp    Pulse 64 09/23/23 15:47  Resp 25 09/23/23 15:47  SpO2 100 % 09/23/23 15:47  Vitals shown include unfiled device data.  Last Pain:  Vitals:   09/23/23 1229  TempSrc: Temporal  PainSc: 10-Worst pain ever         Complications: No notable events documented.

## 2023-09-23 NOTE — Progress Notes (Signed)
 Initial Nutrition Assessment  DOCUMENTATION CODES:   Non-severe (moderate) malnutrition in context of social or environmental circumstances  INTERVENTION:   -Once diet is advanced, add:   -Ensure Plus High Protein po BID, each supplement provides 350 kcal and 20 grams of protein  -MVI with minerals daily  NUTRITION DIAGNOSIS:   Moderate Malnutrition related to social / environmental circumstances as evidenced by mild fat depletion, moderate fat depletion, mild muscle depletion, moderate muscle depletion.  GOAL:   Patient will meet greater than or equal to 90% of their needs  MONITOR:   PO intake, Supplement acceptance  REASON FOR ASSESSMENT:   Consult Assessment of nutrition requirement/status, Hip fracture protocol  ASSESSMENT:   Pt with medical history significant of hypothyroidism, cervical CA, presented with mechanical fall and right hip pain.  Pt admitted with rt femur fracture.   Reviewed I/O's: -500 ml x 24 hours  UOP: 700 ml x 24 hours  Per orthopedics notes, plan for rt cephalopmedullary nailing today. Pt is currently NPO for procedure.   Case discussed with RN, who was in room at time of visit, administering medications. She reports pt able to tolerate meds pass without difficulty. She confirms plan for surgery today.   Spoke with pt at bedside, who reports that she is in pain and unable to offer much history. She reports that she was eating well PTA. Pt reports that she is unable to get comfortable and hard a hard night last night. Emotional support provided.   Reviewed wt hx; wt has been stable for the past year.   Pt with increased nutritional needs and would benefit from addition of oral nutrition supplements.   Medications reviewed and include zofran .   Labs reviewed: Na: 134.    NUTRITION - FOCUSED PHYSICAL EXAM:  Flowsheet Row Most Recent Value  Orbital Region Moderate depletion  Upper Arm Region Mild depletion  Thoracic and Lumbar Region No  depletion  Buccal Region No depletion  Temple Region Mild depletion  Clavicle Bone Region Mild depletion  Clavicle and Acromion Bone Region Mild depletion  Scapular Bone Region Mild depletion  Dorsal Hand Mild depletion  Patellar Region Mild depletion  Anterior Thigh Region Mild depletion  Posterior Calf Region Mild depletion  Edema (RD Assessment) None  Hair Reviewed  Eyes Reviewed  Mouth Reviewed  Skin Reviewed  Nails Reviewed    Diet Order:   Diet Order             Diet NPO time specified  Diet effective midnight                   EDUCATION NEEDS:   No education needs have been identified at this time  Skin:  Skin Assessment: Reviewed RN Assessment  Last BM:  Unknown  Height:   Ht Readings from Last 1 Encounters:  09/23/23 5' 6 (1.676 m)    Weight:   Wt Readings from Last 1 Encounters:  09/23/23 58.1 kg    Ideal Body Weight:  59.1 kg  BMI:  Body mass index is 20.66 kg/m.  Estimated Nutritional Needs:   Kcal:  1850-2050  Protein:  90-105 grams  Fluid:  1.8-2.0 L    Herschel Lords, RD, LDN, CDCES Registered Dietitian III Certified Diabetes Care and Education Specialist If unable to reach this RD, please use RD Inpatient group chat on secure chat between hours of 8am-4 pm daily

## 2023-09-23 NOTE — Anesthesia Procedure Notes (Signed)
 Procedure Name: Intubation Date/Time: 09/23/2023 1:59 PM  Performed by: Ian Maine, RNPre-anesthesia Checklist: Patient identified, Emergency Drugs available, Suction available and Patient being monitored Patient Re-evaluated:Patient Re-evaluated prior to induction Oxygen Delivery Method: Circle system utilized Preoxygenation: Pre-oxygenation with 100% oxygen Induction Type: IV induction Ventilation: Mask ventilation without difficulty Laryngoscope Size: McGrath and 3 Grade View: Grade I Tube type: Oral Tube size: 7.0 mm Number of attempts: 1 Airway Equipment and Method: Stylet Placement Confirmation: ETT inserted through vocal cords under direct vision, positive ETCO2 and breath sounds checked- equal and bilateral Secured at: 20 cm Tube secured with: Tape Dental Injury: Teeth and Oropharynx as per pre-operative assessment  Comments: DL x1 with McGrath MAC 3 blade, grade 1 view. Atraumatic intubation. Dentition unchanged from preop baseline. CRNA and Anesthesiologist present and readily available to assist SRNA.

## 2023-09-23 NOTE — Consult Note (Signed)
 ORTHOPAEDIC CONSULTATION  REQUESTING PHYSICIAN: Montey Apa, DO  ASSESSMENT AND PLAN: 73 y.o. female with the following: Right Hip Intertrochanteric femur fracture  This patient requires inpatient admission to the hospitalist, to include preoperative clearance and perioperative medical management  - Weight Bearing Status/Activity: NWB Right lower extremity  - Additional recommended labs/tests: Preop Labs: CBC, BMP, PT/INR, Chest XR, and EKG  -VTE Prophylaxis: Please hold prior to OR; to resume POD#1 at the discretion of the primary team  - Pain control: Recommend PO pain medications PRN; judicious use of narcotics  - Follow-up plan: F/u 10-14 days postop  -Procedures: Plan for OR once patient has been medically optimized  Plan for Right Cephalomedullary nail   DOS 09/23/23  Chief Complaint: Right hip pain  HPI: Cathy Hunt is a 73 y.o. female who presented to the ED for evaluation after sustaining a mechanical fall.  She is very active.  She was at home, coming down from a ladder, when she missed the step and fell.  She landed on her right side.  She had immediate pain.  She was unable to ambulate.  Radiographs demonstrate a right hip fracture.  She has been admitted for further management.  No pain elsewhere.  She did not hit her head.  She remembers the entire event.  Past Medical History:  Diagnosis Date   Arthritis    Cancer (HCC)    Cervical   Complication of anesthesia    difficulty following verbal commands after vein ligation surgery   Dental crowns present    a lot   Headache    migraines   Hypothyroidism    Insomnia    PONV (postoperative nausea and vomiting)    Past Surgical History:  Procedure Laterality Date   APPENDECTOMY     BILATERAL SALPINGECTOMY  08/21/2015   Procedure: BILATERAL SALPINGECTOMY;  Surgeon: Joselyn Nicely Schermerhorn, MD;  Location: [facility] ORS;  Service: Gynecology;;   BREAST EXCISIONAL BIOPSY Right 2008   neg   BREAST SURGERY  Right    Lumpectomy   CERVICAL CONE BIOPSY  1984   COLONOSCOPY WITH PROPOFOL  N/A 03/23/2015   Procedure: COLONOSCOPY WITH PROPOFOL ;  Surgeon: Stephens Eis, MD;  Location: [facility] END[facility];  Service: Gastroenterology;  Laterality: N/A;   DIAGNOSTIC LAPAROSCOPY     SHOULDER ARTHROSCOPY Left 12/13/2015   Procedure: arthroscopic debridement, arthroscopic subacromial decompression, and mini-open rotator cuff repair, left shoulder;  Surgeon: Elner Hahn, MD;  Location: [facility];  Service: Orthopedics;  Laterality: Left;  Patien requests to be first   TONSILLECTOMY     TUBAL LIGATION     VAGINAL HYSTERECTOMY N/A 08/21/2015   Procedure: HYSTERECTOMY VAGINAL;  Surgeon: Carolynn Citrin, MD;  Location: [facility] ORS;  Service: Gynecology;  Laterality: N/A;   VAGINAL HYSTERECTOMY     VEIN LIGATION AND STRIPPING  1985   Social History   Socioeconomic History   Marital status: Divorced    Spouse name: Not on file   Number of children: Not on file   Years of education: Not on file   Highest education level: Not on file  Occupational History   Not on file  Tobacco Use   Smoking status: Never   Smokeless tobacco: Never  Vaping Use   Vaping status: Never Used  Substance and Sexual Activity   Alcohol  use: Yes    Comment: social   Drug use: No   Sexual activity: Not on file  Other Topics Concern   Not on file  Social History Narrative   Not on file   Social Drivers of Health   Financial Resource Strain: Not on file  Food Insecurity: Not on file  Transportation Needs: Not on file  Physical Activity: Not on file  Stress: Not on file  Social Connections: Not on file   Family History  Problem Relation Age of Onset   Heart attack Father 17   Cervical cancer Sister    Breast cancer Neg Hx    Allergies  Allergen Reactions   Codeine Other (See Comments)    drop in blood pressure   Penicillins Swelling   Phenergan [Promethazine Hcl] Other (See Comments)    drop in blood  pressure   Promethazine Other (See Comments)    Low BP   Adhesive [Tape] Rash   Latex Rash   Prior to Admission medications   Medication Sig Start Date End Date Taking? Authorizing Provider  levothyroxine (SYNTHROID) 112 MCG tablet Take 112 mcg by mouth daily. 08/05/20  Yes [provider]  Multiple Vitamins-Minerals (HM MULTIVITAMIN ADULT GUMMY) CHEW Chew 1 Dose by mouth every morning.   Yes [provider]  rosuvastatin  (CRESTOR ) 5 MG tablet Take 1 tablet (5 mg total) by mouth daily. 05/15/22  Yes Gollan, Timothy J, MD  SUMAtriptan (IMITREX) 50 MG tablet Take 50 mg by mouth. 12/16/22 12/16/23 Yes [provider]  ALPRAZolam (XANAX) 0.5 MG tablet Take by mouth. Patient not taking: Reported on 05/09/2021 08/09/20   [provider]  aspirin EC 81 MG tablet Take 81 mg by mouth daily.    [provider]  aspirin-acetaminophen -caffeine (EXCEDRIN MIGRAINE) 250-250-65 MG tablet Take 2 tablets by mouth every 6 (six) hours as needed for headache.    [provider]  Calcium  Carb-Cholecalciferol 1000-800 MG-UNIT TABS Take 2 capsules by mouth daily. Patient not taking: Reported on 05/15/2022    [provider]  docusate sodium  (COLACE) 100 MG capsule Take 1 capsule (100 mg total) by mouth 2 (two) times daily. Patient not taking: Reported on 05/09/2021 08/22/15   Schermerhorn, Joselyn Nicely, MD  etodolac  (LODINE ) 400 MG tablet Take 1 tablet (400 mg total) by mouth 2 (two) times daily. Patient not taking: Reported on 05/09/2021 12/13/15   Poggi, John J, MD  levothyroxine (SYNTHROID, LEVOTHROID) 175 MCG tablet Take 150 mcg by mouth daily before breakfast.  Patient not taking: Reported on 05/09/2021    [provider]  ondansetron  (ZOFRAN  ODT) 8 MG disintegrating tablet Take 1 tablet (8 mg total) by mouth every 8 (eight) hours as needed for nausea or vomiting. Patient not taking: Reported on 05/09/2021 08/22/15   Schermerhorn, Joselyn Nicely, MD  oxyCODONE   (ROXICODONE ) 5 MG immediate release tablet Take 1-2 tablets (5-10 mg total) by mouth every 4 (four) hours as needed for severe pain. Patient not taking: Reported on 05/09/2021 12/13/15   Poggi, Kaylene Pascal, MD  predniSONE (DELTASONE) 20 MG tablet Take by mouth. Patient not taking: Reported on 01/23/2022 04/30/21   [provider]  PREMARIN  vaginal cream INSERT 1/4 APPLICATORFUL VAGINALLY 2 TIMES A WEEK Patient not taking: Reported on 05/09/2021 05/18/20   Allen, Lauren G, NP  tiZANidine (ZANAFLEX) 2 MG tablet Take 2 mg by mouth at bedtime as needed for muscle spasms. Patient not taking: Reported on 05/09/2021    [provider]   DG Chest Portable 1 View Result Date: 09/22/2023 CLINICAL DATA:  Fall EXAM: PORTABLE CHEST 1 VIEW COMPARISON:  03/22/2022 FINDINGS: Limited study as the patient was imaged in the  left lateral decubitus position. This causes volume loss with areas of peripheral opacities in the left lung, likely atelectasis. Right lung clear. Heart mediastinal contours within normal limits. No visible pneumothorax or acute bony abnormality. IMPRESSION: Limited study performed in the left lateral decubitus position. Areas of atelectasis in the left lung likely related to positioning. No definite active process. Electronically Signed   By: Janeece Mechanic M.D.   On: 09/22/2023 14:24   DG Hip Unilat W or Wo Pelvis 2-3 Views Right Result Date: 09/22/2023 CLINICAL DATA:  Fall, right hip pain EXAM: DG HIP (WITH OR WITHOUT PELVIS) 2-3V RIGHT COMPARISON:  None Available. FINDINGS: There is a right femoral intertrochanteric fracture with varus angulation and impaction. No subluxation or dislocation. Mild degenerative changes in the hips. SI joints symmetric and unremarkable. IMPRESSION: Right femoral intertrochanteric fracture with varus angulation and impaction. Electronically Signed   By: Janeece Mechanic M.D.   On: 09/22/2023 14:23   Family History Reviewed and non-contributory, no pertinent history of  problems with bleeding or anesthesia    Review of Systems No fevers or chills No numbness or tingling No chest pain No shortness of breath No bowel or bladder dysfunction No GI distress No headaches    OBJECTIVE  Vitals:Patient Vitals for the past 8 hrs:  BP Temp Pulse Resp SpO2  09/23/23 0818 (!) 109/59 98.3 F (36.8 C) 70 17 98 %  09/23/23 0454 106/60 98.6 F (37 C) 70 18 97 %   General: Alert, no acute distress Cardiovascular: Extremities are warm Respiratory: No cyanosis, no use of accessory musculature Skin: No lesions in the area of chief complaint  Neurologic: Sensation intact distally  Psychiatric: Patient is competent for consent with normal mood and affect Lymphatic: No swelling obvious and reported other than the area involved in the exam below Extremities  RLE: Extremity held in a fixed position.  ROM deferred due to known fracture.  Sensation is intact distally in the sural, saphenous, DP, SP, and plantar nerve distribution. 2+ DP pulse.  Toes are WWP.  Active motion intact in the TA/EHL/GS. LLE: Sensation is intact distally in the sural, saphenous, DP, SP, and plantar nerve distribution. 2+ DP pulse.  Toes are WWP.  Active motion intact in the TA/EHL/GS. Tolerates gentle ROM of the hip.  No pain with axial loading.     Test Results Imaging XR of the Right hip demonstrates a Intertrochanteric femur fracture.  Labs cbc Recent Labs    09/22/23 1308 09/23/23 0424  WBC 6.6 13.8*  HGB 11.9* 11.2*  HCT 36.9 33.6*  PLT 208 199     Recent Labs    09/22/23 1308 09/23/23 0424  NA 140 134*  K 3.6 4.0  CL 108 101  CO2 23 26  GLUCOSE 120* 155*  BUN 9 15  CREATININE 0.65 0.60  CALCIUM  8.8* 8.8*

## 2023-09-23 NOTE — Anesthesia Postprocedure Evaluation (Signed)
 Anesthesia Post Note  Patient: Doreatha Gamer  Procedure(s) Performed: FIXATION, FRACTURE, INTERTROCHANTERIC, WITH INTRAMEDULLARY ROD (Right: Leg Upper)  Patient location during evaluation: PACU Anesthesia Type: General Level of consciousness: awake and alert Pain management: pain level controlled Vital Signs Assessment: post-procedure vital signs reviewed and stable Respiratory status: spontaneous breathing, nonlabored ventilation, respiratory function stable and patient connected to nasal cannula oxygen Cardiovascular status: blood pressure returned to baseline and stable Postop Assessment: no apparent nausea or vomiting Anesthetic complications: no   No notable events documented.   Last Vitals:  Vitals:   09/23/23 1615 09/23/23 1630  BP: 130/66 123/63  Pulse: 63 65  Resp: 13 15  Temp:    SpO2: 100% 93%    Last Pain:  Vitals:   09/23/23 1625  TempSrc:   PainSc: 5                  Portia Brittle Barlow Harrison

## 2023-09-23 NOTE — Anesthesia Preprocedure Evaluation (Signed)
 Anesthesia Evaluation  Patient identified by MRN, date of birth, ID band Patient awake    Reviewed: Allergy & Precautions, H&P , NPO status , Patient's Chart, lab work & pertinent test results, reviewed documented beta blocker date and time   History of Anesthesia Complications (+) PONV and history of anesthetic complications  Airway Mallampati: II  TM Distance: >3 FB Neck ROM: full    Dental  (+) Teeth Intact, Dental Advidsory Given, Caps   Pulmonary neg pulmonary ROS   Pulmonary exam normal breath sounds clear to auscultation       Cardiovascular Exercise Tolerance: Good negative cardio ROS Normal cardiovascular exam Rhythm:regular Rate:Normal     Neuro/Psych negative neurological ROS  negative psych ROS   GI/Hepatic negative GI ROS, Neg liver ROS,,,  Endo/Other  neg diabetesHypothyroidism    Renal/GU negative Renal ROS  negative genitourinary   Musculoskeletal   Abdominal   Peds  Hematology negative hematology ROS (+)   Anesthesia Other Findings Past Medical History:   Hypothyroidism                                               Headache                                                     Arthritis                                                    Cancer (HCC)                                                   Comment:Cervical   Insomnia                                                     Complication of anesthesia                                     Comment:difficulty following verbal commands after vein              ligation surgery   PONV (postoperative nausea and vomiting)                     Reproductive/Obstetrics negative OB ROS                             Anesthesia Physical Anesthesia Plan  ASA: 2  Anesthesia Plan: General   Post-op Pain Management:    Induction: Intravenous  PONV Risk Score and Plan: 4 or greater and Propofol  infusion and TIVA  Airway  Management Planned: Oral ETT  Additional Equipment:   Intra-op  Plan:   Post-operative Plan: Extubation in OR  Informed Consent: I have reviewed the patients History and Physical, chart, labs and discussed the procedure including the risks, benefits and alternatives for the proposed anesthesia with the patient or authorized representative who has indicated his/her understanding and acceptance.     Dental Advisory Given  Plan Discussed with: Anesthesiologist, CRNA and Surgeon  Anesthesia Plan Comments:         Anesthesia Quick Evaluation

## 2023-09-24 ENCOUNTER — Inpatient Hospital Stay

## 2023-09-24 DIAGNOSIS — S72001A Fracture of unspecified part of neck of right femur, initial encounter for closed fracture: Secondary | ICD-10-CM | POA: Diagnosis not present

## 2023-09-24 MED ORDER — ADULT MULTIVITAMIN W/MINERALS CH
1.0000 | ORAL_TABLET | Freq: Every day | ORAL | Status: DC
  Administered 2023-09-25: 1 via ORAL
  Filled 2023-09-24: qty 1

## 2023-09-24 MED ORDER — ROSUVASTATIN CALCIUM 5 MG PO TABS
5.0000 mg | ORAL_TABLET | Freq: Every day | ORAL | Status: DC
  Administered 2023-09-24 (×2): 5 mg via ORAL
  Filled 2023-09-24: qty 1

## 2023-09-24 NOTE — Evaluation (Signed)
 Physical Therapy Evaluation Patient Details Name: Cathy Hunt MRN: [id number] DOB: [birth year] Today's Date: 09/24/2023  History of Present Illness  Pt is a 73 yo female s/p R hip IM nailing. PMH of hypothyroidism, arthritis, cervical cancer.  Clinical Impression  Patient alert, agreeable to PT, reported 3/10 R hip pain. At baseline the pt is independent, lives alone, and stays active, enjoys pickleball. She was able to perform a few supine exercises AAROM for RLE due to stiffness, and then minA to come up into sitting EOB. Pt educated on hand placement for RW, able to stand with CGA from EOB, minA from commode. She ambulated ~186ft with RW and CGA, very decreased gait velocity noted, a few standing rest breaks needed due to fatigue. Able to progress to intermittent step through gait pattern with verbal and tactile cues for RW. Pt up in recliner with needs in reach at end of session.  Overall the patient demonstrated deficits (see PT Problem List) that impede the patient's functional abilities, safety, and mobility and would benefit from skilled PT intervention.           If plan is discharge home, recommend the following: A little help with walking and/or transfers;A little help with bathing/dressing/bathroom;Assistance with cooking/housework;Assist for transportation;Help with stairs or ramp for entrance   Can travel by private vehicle        Equipment Recommendations Rolling walker (2 wheels);BSC/3in1  Recommendations for Other Services       Functional Status Assessment Patient has had a recent decline in their functional status and demonstrates the ability to make significant improvements in function in a reasonable and predictable amount of time.     Precautions / Restrictions Precautions Precautions: Fall Recall of Precautions/Restrictions: Intact Restrictions Weight Bearing Restrictions Per Provider Order: Yes RLE Weight Bearing Per Provider Order: Weight bearing as  tolerated      Mobility  Bed Mobility Overal bed mobility: Needs Assistance Bed Mobility: Supine to Sit     Supine to sit: Min assist     General bed mobility comments: for RLE assist    Transfers Overall transfer level: Needs assistance Equipment used: Rolling walker (2 wheels) Transfers: Sit to/from Stand Sit to Stand: Contact guard assist, Min assist           General transfer comment: minA from commode, CGA for EOB, poor eccentric control noted for descent to chair    Ambulation/Gait Ambulation/Gait assistance: Contact guard assist Gait Distance (Feet): 100 Feet Assistive device: Rolling walker (2 wheels)   Gait velocity: decreased     General Gait Details: slow, step to progressed with cues to intermittent step through. pt complained of fatigue, a few standing rest breaks needed  Stairs            Wheelchair Mobility     Tilt Bed    Modified Rankin (Stroke Patients Only)       Balance Overall balance assessment: Needs assistance Sitting-balance support: Feet supported Sitting balance-Leahy Scale: Good     Standing balance support: Bilateral upper extremity supported, During functional activity Standing balance-Leahy Scale: Fair                               Pertinent Vitals/Pain Pain Assessment Pain Assessment: 0-10 Pain Score: 3  Pain Location: R hip Pain Descriptors / Indicators: Sore, Aching, Tightness Pain Intervention(s): Limited activity within patient's tolerance, Monitored during session, Repositioned    Home Living Family/patient expects to be discharged  to:: Private residence Living Arrangements: Alone Available Help at Discharge: Family Type of Home: House Home Access: Stairs to enter   Secretary/administrator of Steps: 1   Home Layout: One level Home Equipment: None Additional Comments: 4 into daugthers going up on L rail, multi level house but could stay on the first floor    Prior Function Prior  Level of Function : Independent/Modified Independent;Driving                     Extremity/Trunk Assessment   Upper Extremity Assessment Upper Extremity Assessment: Overall WFL for tasks assessed    Lower Extremity Assessment Lower Extremity Assessment:  (assistance needed to move RLE for bed mobility)       Communication        Cognition Arousal: Alert Behavior During Therapy: WFL for tasks assessed/performed   PT - Cognitive impairments: No apparent impairments                         Following commands: Intact       Cueing       General Comments      Exercises     Assessment/Plan    PT Assessment Patient needs continued PT services  PT Problem List Decreased strength;Pain;Decreased range of motion;Decreased activity tolerance;Decreased balance;Decreased knowledge of use of DME;Decreased mobility;Decreased knowledge of precautions       PT Treatment Interventions DME instruction;Balance training;Gait training;Neuromuscular re-education;Stair training;Functional mobility training;Patient/family education;Therapeutic activities;Therapeutic exercise    PT Goals (Current goals can be found in the Care Plan section)  Acute Rehab PT Goals Patient Stated Goal: return to 100% PLOF PT Goal Formulation: With patient Time For Goal Achievement: 10/08/23 Potential to Achieve Goals: Good    Frequency BID     Co-evaluation PT/OT/SLP Co-Evaluation/Treatment: Yes Reason for Co-Treatment: To address functional/ADL transfers PT goals addressed during session: Mobility/safety with mobility OT goals addressed during session: ADL's and self-care       AM-PAC PT 6 Clicks Mobility  Outcome Measure Help needed turning from your back to your side while in a flat bed without using bedrails?: A Little Help needed moving from lying on your back to sitting on the side of a flat bed without using bedrails?: A Little Help needed moving to and from a bed to a  chair (including a wheelchair)?: A Little Help needed standing up from a chair using your arms (e.g., wheelchair or bedside chair)?: A Little Help needed to walk in hospital room?: A Little Help needed climbing 3-5 steps with a railing? : A Little 6 Click Score: 18    End of Session Equipment Utilized During Treatment: Gait belt Activity Tolerance: Patient tolerated treatment well Patient left: in chair;with call bell/phone within reach;with chair alarm set Nurse Communication: Mobility status PT Visit Diagnosis: Other abnormalities of gait and mobility (R26.89);Difficulty in walking, not elsewhere classified (R26.2);Muscle weakness (generalized) (M62.81);Pain Pain - Right/Left: Right Pain - part of body: Hip    Time: 1610-9604 PT Time Calculation (min) (ACUTE ONLY): 33 min   Charges:   PT Evaluation $PT Eval Low Complexity: 1 Low PT Treatments $Therapeutic Activity: 8-22 mins PT General Charges $$ ACUTE PT VISIT: 1 Visit       Darien Eden PT, DPT 10:25 AM,09/24/23

## 2023-09-24 NOTE — Progress Notes (Signed)
   ORTHOPAEDIC PROGRESS NOTE  s/p Procedure(s): Cephalomedullary nail for right peritrochanteric femur fracture  DOS: 09/23/2023  SUBJECTIVE: Pain is much better following surgery.  She has been up, ambulating in the halls with the assistance of physical therapy.  She is planning to return home with assistance.  No issues with her dressings.  Pain is controlled.  No numbness or tingling.  OBJECTIVE: PE:  Alert and oriented.  No acute distress  Lateral surgical dressings are intact.  Small amount of strikethrough.  They are otherwise clean and dry Sensation intact to the right foot Toes are warm and well-perfused. Mild swelling of the right thigh.  Vitals:   09/24/23 0309 09/24/23 0850  BP: 105/69 (!) 102/52  Pulse: 66 73  Resp: 16 16  Temp: 98.1 F (36.7 C) 98.5 F (36.9 C)  SpO2: 98% 99%      Latest Ref Rng & Units 09/23/2023    4:24 AM 09/22/2023    1:08 PM 03/22/2022   10:54 AM  CBC  WBC 4.0 - 10.5 K/uL 13.8  6.6  6.9   Hemoglobin 12.0 - 15.0 g/dL 40.9  81.1  91.4   Hematocrit 36.0 - 46.0 % 33.6  36.9  40.6   Platelets 150 - 400 K/uL 199  208  259      ASSESSMENT: Cathy Hunt is a 73 y.o. female doing well postoperatively.  PLAN: Weightbearing: WBAT RLE Incisional and dressing care: Reinforce dressings as needed Orthopedic device(s): None VTE prophylaxis: At the discretion of the primary team recommend 81 mg of aspirin twice daily for 6 weeks Pain control: As needed.  Judicious use of narcotics. Follow - up plan: 2 weeks  Please schedule a follow up in 2 weeks at the [facility].  Please call [phone] and specifically request an appointment in [place].  If there are any issues, I can be contacted at the office in [place] [phone]    Contact information:     Delesha Pohlman A. Ernesta Heading, MD MS [facility] [address] [place],  [place]  [address] Phone: [phone] Fax: [phone]

## 2023-09-24 NOTE — Progress Notes (Signed)
 Physical Therapy Treatment Patient Details Name: Cathy Hunt MRN: [id number] DOB: 12-04-1950 Today's Date: 09/24/2023   History of Present Illness Pt is a 73 yo female s/p R hip IM nailing. PMH of hypothyroidism, arthritis, cervical cancer.    PT Comments  Patient alert, agreeable to PT, reported 4/10 R hip pain. Pt more challenged with transfers this PM due to spasms, RN notified. Bed mobility remained minA, minA for 3 out of 4 sit <> stands with RW. Able to ambulate ~161ft, toilet and perform pericare CGA. Stair navigation performed as well, instance of minA for steadying due to pt reported spasms. Pt would benefit from reinforcement of technique. Returned to room and in bed with needs in reach. The patient would benefit from further skilled PT intervention to continue to progress towards goals.    If plan is discharge home, recommend the following: A little help with walking and/or transfers;A little help with bathing/dressing/bathroom;Assistance with cooking/housework;Assist for transportation;Help with stairs or ramp for entrance   Can travel by private vehicle        Equipment Recommendations  Rolling walker (2 wheels);BSC/3in1    Recommendations for Other Services       Precautions / Restrictions Precautions Precautions: Fall Recall of Precautions/Restrictions: Intact Restrictions Weight Bearing Restrictions Per Provider Order: Yes RLE Weight Bearing Per Provider Order: Weight bearing as tolerated     Mobility  Bed Mobility Overal bed mobility: Needs Assistance Bed Mobility: Supine to Sit, Sit to Supine     Supine to sit: Min assist Sit to supine: Min assist   General bed mobility comments: for RLE assist    Transfers Overall transfer level: Needs assistance Equipment used: Rolling walker (2 wheels) Transfers: Sit to/from Stand Sit to Stand: Min assist, Contact guard assist           General transfer comment: minA x3 attempts, CGA from recliner once     Ambulation/Gait Ambulation/Gait assistance: Contact guard assist Gait Distance (Feet): 100 Feet Assistive device: Rolling walker (2 wheels)   Gait velocity: decreased     General Gait Details: decreased velocity, trouble with weight bearing due to muscle spasms   Stairs Stairs: Yes Stairs assistance: Min assist, Contact guard assist Stair Management: One rail Right, No rails, Forwards, With walker Number of Stairs: 5 General stair comments: 1 curb navigation with RW, CGA. 4 steps with L rail, once instance of minA for steadying due to muscle spasms   Wheelchair Mobility     Tilt Bed    Modified Rankin (Stroke Patients Only)       Balance Overall balance assessment: Needs assistance Sitting-balance support: Feet supported Sitting balance-Leahy Scale: Good     Standing balance support: Bilateral upper extremity supported, During functional activity Standing balance-Leahy Scale: Fair                              Communication    Cognition Arousal: Alert Behavior During Therapy: WFL for tasks assessed/performed   PT - Cognitive impairments: No apparent impairments                         Following commands: Intact      Cueing    Exercises      General Comments        Pertinent Vitals/Pain Pain Assessment Pain Assessment: 0-10 Pain Score: 4  Pain Location: R hip Pain Descriptors / Indicators: Sore, Aching, Tightness Pain Intervention(s): Limited activity  within patient's tolerance, Monitored during session, Repositioned, Patient requesting pain meds-RN notified    Home Living                          Prior Function            PT Goals (current goals can now be found in the care plan section) Progress towards PT goals: Progressing toward goals    Frequency    BID      PT Plan      Co-evaluation PT/OT/SLP Co-Evaluation/Treatment: Yes            AM-PAC PT 6 Clicks Mobility   Outcome Measure   Help needed turning from your back to your side while in a flat bed without using bedrails?: A Little Help needed moving from lying on your back to sitting on the side of a flat bed without using bedrails?: A Little Help needed moving to and from a bed to a chair (including a wheelchair)?: A Little Help needed standing up from a chair using your arms (e.g., wheelchair or bedside chair)?: A Little Help needed to walk in hospital room?: A Little Help needed climbing 3-5 steps with a railing? : A Little 6 Click Score: 18    End of Session Equipment Utilized During Treatment: Gait belt Activity Tolerance: Patient tolerated treatment well Patient left: with call bell/phone within reach;in bed;with bed alarm set Nurse Communication: Mobility status PT Visit Diagnosis: Other abnormalities of gait and mobility (R26.89);Difficulty in walking, not elsewhere classified (R26.2);Muscle weakness (generalized) (M62.81);Pain Pain - Right/Left: Right Pain - part of body: Hip     Time: 1610-9604 PT Time Calculation (min) (ACUTE ONLY): 48 min  Charges:    $Gait Training: 8-22 mins $Therapeutic Activity: 23-37 mins PT General Charges $$ ACUTE PT VISIT: 1 Visit                     Darien Eden PT, DPT 3:30 PM,09/24/23

## 2023-09-24 NOTE — Progress Notes (Signed)
 Progress Note   Patient: Cathy Hunt ZOX:[id number] DOB: 01-07-1951 DOA: 09/22/2023     2 DOS: the patient was seen and examined on 09/24/2023   Brief hospital course: From HPI Cathy Hunt is a 73 y.o. female with medical history significant of hypothyroidism, cervical CA, presented with mechanical fall and right hip pain.   Patient was descending from a ladder at home missed the last step and fell on the right hip.  With excruciating pain she could not stand up and walk.  Denied any head neck injury no LOC.  No prodrome of lightheadedness palpitations before during the event.    Assessment and Plan:   Right femur intertrochanteric comminuted fracture Patient underwent ORIF by orthopedic surgeon on 09/23/2023 Continue postoperative pain management Continue PT OT Plan of care discussed with orthopedic surgeon  Hypothyroidism - Continue Synthroid   DVT prophylaxis: Lovenox Code Status: Full code Family Communication: Daughter at bedside Disposition Plan: Patient is sick with hip fracture requiring ORIF, expect more than 2 midnight hospital stay Consults called: Orthopedic surgery Admission status: MedSurg admission     Subjective:  Patient admits to pain involving the hip however improved Denies nausea vomiting abdominal pain or chest pain  Physical Exam:  Eyes: PERRL, lids and conjunctivae normal ENMT: Mucous membranes are moist. Posterior pharynx clear of any exudate or lesions.Normal dentition.  Neck: normal, supple, no masses, no thyromegaly Respiratory: clear to auscultation bilaterally, no wheezing, no crackles. Normal respiratory effort. No accessory muscle use.  Cardiovascular: Regular rate and rhythm, no murmurs / rubs / gallops. No extremity edema. 2+ pedal pulses. No carotid bruits.  Abdomen: no tenderness, no masses palpated. No hepatosplenomegaly. Bowel sounds positive.  Musculoskeletal: Right leg rotated and shortened Skin: no rashes, lesions, ulcers. No  induration Neurologic: CN 2-12 grossly intact. Sensation intact, DTR normal. Strength 5/5 in all 4.  Psychiatric: Normal judgment and insight. Alert and oriented x 3. Normal mood.     Vitals:   09/24/23 0053 09/24/23 0309 09/24/23 0850 09/24/23 1601  BP: 102/61 105/69 (!) 102/52 100/62  Pulse: 60 66 73 73  Resp: 17 16 16 16   Temp: 98 F (36.7 C) 98.1 F (36.7 C) 98.5 F (36.9 C) 98.7 F (37.1 C)  TempSrc: Oral  Oral Oral  SpO2: 98% 98% 99% 98%  Weight:      Height:        Data Reviewed: Review of hip x-ray showing comminuted intertrochanteric fracture of the right hip    Latest Ref Rng & Units 09/23/2023    4:24 AM 09/22/2023    1:08 PM 03/22/2022   10:54 AM  CBC  WBC 4.0 - 10.5 K/uL 13.8  6.6  6.9   Hemoglobin 12.0 - 15.0 g/dL 81.1  91.4  78.2   Hematocrit 36.0 - 46.0 % 33.6  36.9  40.6   Platelets 150 - 400 K/uL 199  208  259        Latest Ref Rng & Units 09/23/2023    4:24 AM 09/22/2023    1:08 PM 03/22/2022   10:54 AM  BMP  Glucose 70 - 99 mg/dL 956  213  086   BUN 8 - 23 mg/dL 15  9  12    Creatinine 0.44 - 1.00 mg/dL 5.78  4.69  6.29   Sodium 135 - 145 mmol/L 134  140  136   Potassium 3.5 - 5.1 mmol/L 4.0  3.6  4.3   Chloride 98 - 111 mmol/L 101  108  102  CO2 22 - 32 mmol/L 26  23  25    Calcium  8.9 - 10.3 mg/dL 8.8  8.8  9.6      Family Communication: None present at bedside  Disposition: Status is: Inpatient   Time spent: 54 minutes  Author: Ezzard Holms, MD 09/24/2023 6:53 PM  For on call review [email or web address].

## 2023-09-24 NOTE — Evaluation (Signed)
 Occupational Therapy Evaluation Patient Details Name: Jamyah Folk MRN: [id number] DOB: [birth year] Today's Date: 09/24/2023   History of Present Illness   Pt is a 73 yo female s/p R hip IM nailing. PMH of hypothyroidism, arthritis, cervical cancer.     Clinical Impressions Chart reviewed to date, pt greeted in bed, agreeable to OT evaluation. Pt is alert and oriented x4. PTA pt is MOD I-I in ADL/IADL, amb with no AD, is active including likes to play pickleball. Pt presents with deficits in activity tolerance, endurance, balance, affecting safe and optimal ADL completion. MIN A required for bed mobility, STS with CGA-MIN A, amb in room to bathroom with RW with CGA, MIN A for toilet transfer to commode, CGA for peri care, intermittent vcs for technique. Anticipate MAX A for LB dressing on this date, SET UP required for grooming/feeding tasks. Edu pt/daughter re: safe ADL completion with use of DME/AE. Pt is left in bedside chair, all needs met. OT will follow to facilitate return to PLOF and facilitate optimal ADL/functional mobility.      If plan is discharge home, recommend the following:   A little help with walking and/or transfers;A lot of help with bathing/dressing/bathroom;Help with stairs or ramp for entrance;Assistance with cooking/housework     Functional Status Assessment   Patient has had a recent decline in their functional status and demonstrates the ability to make significant improvements in function in a reasonable and predictable amount of time.     Equipment Recommendations   BSC/3in1;Tub/shower bench (2WW)     Recommendations for Other Services         Precautions/Restrictions   Precautions Precautions: Fall Recall of Precautions/Restrictions: Intact Restrictions Weight Bearing Restrictions Per Provider Order: Yes RLE Weight Bearing Per Provider Order: Weight bearing as tolerated     Mobility Bed Mobility Overal bed mobility: Needs  Assistance Bed Mobility: Supine to Sit     Supine to sit: Min assist          Transfers Overall transfer level: Needs assistance Equipment used: Rolling walker (2 wheels) Transfers: Sit to/from Stand Sit to Stand: Contact guard assist, Min assist                  Balance Overall balance assessment: Needs assistance Sitting-balance support: Feet supported Sitting balance-Leahy Scale: Good     Standing balance support: Bilateral upper extremity supported, During functional activity Standing balance-Leahy Scale: Fair                             ADL either performed or assessed with clinical judgement   ADL Overall ADL's : Needs assistance/impaired                                       General ADL Comments: SET UP for seated grooming/feeding tasks; anticipate MAX A for LB dressing, MIN A for toilet transfer to bedside commode, CGA for toileting after continent urine on toilet using lateral leans; intermittent vcs required for technique, amb to/from bathroom with RW with CGA     Vision Baseline Vision/History: 1 Wears glasses Patient Visual Report: No change from baseline       Perception         Praxis         Pertinent Vitals/Pain Pain Assessment Pain Assessment: 0-10 Pain Score: 3  Pain Location: R hip Pain Descriptors /  Indicators: Sore, Aching, Tightness Pain Intervention(s): Monitored during session, Limited activity within patient's tolerance, Repositioned     Extremity/Trunk Assessment Upper Extremity Assessment Upper Extremity Assessment: Overall WFL for tasks assessed   Lower Extremity Assessment Lower Extremity Assessment: Defer to PT evaluation       Communication Communication Communication: No apparent difficulties   Cognition Arousal: Alert Behavior During Therapy: WFL for tasks assessed/performed Cognition: No apparent impairments                               Following commands:  Intact       Cueing  General Comments   Cueing Techniques: Verbal cues  vss throughout   Exercises Other Exercises Other Exercises: edu pt/daugther re: role of OT, role of rehab, discharge recommendations, safe ADL completion with DME/AE   Shoulder Instructions      Home Living Family/patient expects to be discharged to:: Private residence Living Arrangements: Alone Available Help at Discharge: Family Type of Home: House Home Access: Stairs to enter Secretary/administrator of Steps: 1 Entrance Stairs-Rails: None Home Layout: One level     Bathroom Shower/Tub: Producer, television/film/video: Standard Bathroom Accessibility: Yes   Home Equipment: None   Additional Comments: 4 into daugthers going up on L rail, multi level house but could stay on the first floor      Prior Functioning/Environment Prior Level of Function : Independent/Modified Independent;Driving                    OT Problem List: Decreased activity tolerance;Impaired balance (sitting and/or standing);Decreased knowledge of use of DME or AE   OT Treatment/Interventions: Self-care/ADL training;Therapeutic exercise;Patient/family education;Balance training;Energy conservation;Therapeutic activities;DME and/or AE instruction      OT Goals(Current goals can be found in the care plan section)   Acute Rehab OT Goals Patient Stated Goal: return to 100% OT Goal Formulation: With patient Time For Goal Achievement: 10/07/23 Potential to Achieve Goals: Good ADL Goals Pt Will Perform Grooming: with modified independence;standing;sitting Pt Will Perform Lower Body Dressing: with modified independence;sit to/from stand Pt Will Transfer to Toilet: with modified independence;ambulating Pt Will Perform Toileting - Clothing Manipulation and hygiene: with modified independence;sitting/lateral leans;sit to/from stand   OT Frequency:  Min 2X/week    Co-evaluation PT/OT/SLP Co-Evaluation/Treatment:  Yes Reason for Co-Treatment: To address functional/ADL transfers PT goals addressed during session: Mobility/safety with mobility OT goals addressed during session: ADL's and self-care      AM-PAC OT 6 Clicks Daily Activity     Outcome Measure Help from another person eating meals?: None Help from another person taking care of personal grooming?: None Help from another person toileting, which includes using toliet, bedpan, or urinal?: None Help from another person bathing (including washing, rinsing, drying)?: A Lot Help from another person to put on and taking off regular upper body clothing?: None Help from another person to put on and taking off regular lower body clothing?: A Lot 6 Click Score: 20   End of Session Equipment Utilized During Treatment: Gait belt;Rolling walker (2 wheels) Nurse Communication: Mobility status  Activity Tolerance: Patient tolerated treatment well Patient left: in chair;with call bell/phone within reach;with chair alarm set  OT Visit Diagnosis: Other abnormalities of gait and mobility (R26.89)                Time: 1610-9604 OT Time Calculation (min): 31 min Charges:  OT General Charges $OT Visit: 1 Visit OT  Evaluation $OT Eval Low Complexity: 1 Low  Gerre Kraft, OTD OTR/L  09/24/23, 10:41 AM

## 2023-09-25 ENCOUNTER — Other Ambulatory Visit: Payer: Self-pay

## 2023-09-25 ENCOUNTER — Encounter: Payer: Self-pay | Admitting: Orthopedic Surgery

## 2023-09-25 DIAGNOSIS — S72001A Fracture of unspecified part of neck of right femur, initial encounter for closed fracture: Secondary | ICD-10-CM | POA: Diagnosis not present

## 2023-09-25 LAB — CBC WITH DIFFERENTIAL/PLATELET
Abs Immature Granulocytes: 0.09 10*3/uL — ABNORMAL HIGH (ref 0.00–0.07)
Basophils Absolute: 0.1 10*3/uL (ref 0.0–0.1)
Basophils Relative: 0 %
Eosinophils Absolute: 0.1 10*3/uL (ref 0.0–0.5)
Eosinophils Relative: 0 %
HCT: 28.8 % — ABNORMAL LOW (ref 36.0–46.0)
Hemoglobin: 9.3 g/dL — ABNORMAL LOW (ref 12.0–15.0)
Immature Granulocytes: 1 %
Lymphocytes Relative: 20 %
Lymphs Abs: 2.6 10*3/uL (ref 0.7–4.0)
MCH: 32.2 pg (ref 26.0–34.0)
MCHC: 32.3 g/dL (ref 30.0–36.0)
MCV: 99.7 fL (ref 80.0–100.0)
Monocytes Absolute: 1 10*3/uL (ref 0.1–1.0)
Monocytes Relative: 8 %
Neutro Abs: 8.9 10*3/uL — ABNORMAL HIGH (ref 1.7–7.7)
Neutrophils Relative %: 71 %
Platelets: 153 10*3/uL (ref 150–400)
RBC: 2.89 MIL/uL — ABNORMAL LOW (ref 3.87–5.11)
RDW: 12.7 % (ref 11.5–15.5)
WBC: 12.7 10*3/uL — ABNORMAL HIGH (ref 4.0–10.5)
nRBC: 0 % (ref 0.0–0.2)

## 2023-09-25 LAB — BASIC METABOLIC PANEL WITH GFR
Anion gap: 3 — ABNORMAL LOW (ref 5–15)
BUN: 26 mg/dL — ABNORMAL HIGH (ref 8–23)
CO2: 28 mmol/L (ref 22–32)
Calcium: 8.7 mg/dL — ABNORMAL LOW (ref 8.9–10.3)
Chloride: 108 mmol/L (ref 98–111)
Creatinine, Ser: 0.58 mg/dL (ref 0.44–1.00)
GFR, Estimated: >60 mL/min (ref >60)
Glucose, Bld: 107 mg/dL — ABNORMAL HIGH (ref 70–99)
Potassium: 4.4 mmol/L (ref 3.5–5.1)
Sodium: 139 mmol/L (ref 135–145)

## 2023-09-25 MED ORDER — OXYCODONE HCL 5 MG PO TABS: 5.0000 mg | ORAL_TABLET | ORAL | 0 refills | Status: DC | PRN

## 2023-09-25 MED ORDER — METHOCARBAMOL 500 MG PO TABS
250.0000 mg | ORAL_TABLET | Freq: Once | ORAL | Status: AC
  Administered 2023-09-25: 250 mg via ORAL
  Filled 2023-09-25: qty 1

## 2023-09-25 MED ORDER — METHOCARBAMOL 500 MG PO TABS: 250.0000 mg | ORAL_TABLET | Freq: Three times a day (TID) | ORAL | 0 refills | Status: DC | PRN

## 2023-09-25 MED ORDER — ACETAMINOPHEN 500 MG PO TABS
1000.0000 mg | ORAL_TABLET | Freq: Three times a day (TID) | ORAL | 0 refills | Status: AC | PRN
  Filled 2023-09-25: qty 30, 5d supply, fill #0

## 2023-09-25 MED ORDER — METHOCARBAMOL 500 MG PO TABS: 250.0000 mg | ORAL_TABLET | Freq: Three times a day (TID) | ORAL | Status: DC | PRN

## 2023-09-25 NOTE — Discharge Summary (Signed)
 Physician Discharge Summary   Patient: Cathy Hunt MRN: [id number] DOB: [birth year]  Admit date:     09/22/2023  Discharge date: 09/25/23  Discharge Physician: Ezzard Holms   PCP: Little Riff, MD   Recommendations at discharge:  Follow-up with orthopedics  Discharge Diagnoses: Right femur intertrochanteric comminuted fracture Hypothyroidism   Hospital Course: Cathy Hunt is a 73 y.o. female with medical history significant of hypothyroidism, cervical CA, presented with mechanical fall and right hip pain.  Patient sustained right hip fracture was seen by orthopedic surgeon and underwent surgical repair.  Have been recommended for home health and will follow-up with orthopedics in 2 weeks        Consultants: Orthopedics Procedures performed: As mentioned above Disposition: Home health Diet recommendation:  Cardiac diet DISCHARGE MEDICATION: Allergies as of 09/25/2023       Reactions   Codeine Other (See Comments)   drop in blood pressure   Penicillins Swelling   Phenergan [promethazine Hcl] Other (See Comments)   drop in blood pressure   Promethazine Other (See Comments)   Low BP   Adhesive [tape] Rash   Latex Rash        Medication List     STOP taking these medications    ALPRAZolam 0.5 MG tablet Commonly known as: XANAX   aspirin-acetaminophen -caffeine 250-250-65 MG tablet Commonly known as: EXCEDRIN MIGRAINE   Calcium  Carb-Cholecalciferol 1000-800 MG-UNIT Tabs   etodolac  400 MG tablet Commonly known as: Lodine    ondansetron  8 MG disintegrating tablet Commonly known as: Zofran  ODT   oxyCODONE  5 MG immediate release tablet Commonly known as: Roxicodone    predniSONE 20 MG tablet Commonly known as: DELTASONE   Premarin  vaginal cream Generic drug: conjugated estrogens    tiZANidine 2 MG tablet Commonly known as: ZANAFLEX       TAKE these medications    Acetaminophen  Extra Strength 500 MG Tabs Take 2 tablets (1,000 mg total) by  mouth every 8 (eight) hours as needed.   aspirin EC 81 MG tablet Take 81 mg by mouth daily.   docusate sodium  100 MG capsule Commonly known as: Colace Take 1 capsule (100 mg total) by mouth 2 (two) times daily.   HM Multivitamin Adult Gummy Chew Chew 1 Dose by mouth every morning.   levothyroxine 112 MCG tablet Commonly known as: SYNTHROID Take 112 mcg by mouth daily. What changed: Another medication with the same name was removed. Continue taking this medication, and follow the directions you see here.   methocarbamol 500 MG tablet Commonly known as: ROBAXIN Take 0.5 tablets (250 mg total) by mouth every 8 (eight) hours as needed for muscle spasms.   rosuvastatin  5 MG tablet Commonly known as: CRESTOR  Take 1 tablet (5 mg total) by mouth daily.   SUMAtriptan 50 MG tablet Commonly known as: IMITREX Take 50 mg by mouth.        Follow-up Information     [facility]  Follow up.   Contact information: [address] [place] [address] [phone]         Tonita Frater, MD Follow up.   Specialties: Orthopedic Surgery, Sports Medicine Contact information: [address] [place] [address] [phone]                Discharge Exam: Cleavon Curls Weights   09/23/23 1229  Weight: 58.1 kg   Eyes: PERRL, lids and conjunctivae normal ENMT: Mucous membranes are moist. Posterior pharynx clear of any exudate or lesions.Normal  dentition.  Neck: normal, supple, no masses, no thyromegaly Respiratory: clear to auscultation bilaterally, no wheezing, no crackles. Normal respiratory effort. No accessory muscle use.  Cardiovascular: Regular rate and rhythm, no murmurs / rubs / gallops. No extremity edema. 2+ pedal pulses. No carotid bruits.  Abdomen: no tenderness, no masses palpated. No hepatosplenomegaly. Bowel sounds positive.  Skin: no rashes, lesions, ulcers. No induration Neurologic: CN 2-12 grossly intact. Sensation  intact, DTR normal. Strength 5/5 in all 4.  Psychiatric: Normal judgment and insight. Alert and oriented x 3. Normal mood.   Condition at discharge: good  The results of significant diagnostics from this hospitalization (including imaging, microbiology, ancillary and laboratory) are listed below for reference.   Imaging Studies: DG FEMUR, MIN 2 VIEWS RIGHT Result Date: 09/23/2023 CLINICAL DATA:  Right femoral intramedullary nail placement right EXAM: RIGHT FEMUR 2 VIEWS COMPARISON:  Hip radiograph dated 09/22/2023 FINDINGS: Four fluoroscopic images obtained during right femoral intramedullary nail placement. 1 minute 38 seconds fluoro time utilized. Radiation dose 14.15 mGy Kerma. Please see performing physicians operative report for full details. IMPRESSION: Fluoroscopic images were obtained for intraoperative guidance of right femoral intramedullary nail placement. Electronically Signed   By: Limin  Xu M.D.   On: 09/23/2023 16:38   DG FEMUR PORT, MIN 2 VIEWS RIGHT Result Date: 09/23/2023 CLINICAL DATA:  Status post right femoral intramedullary nail placement EXAM: RIGHT FEMUR PORTABLE 2 VIEW; PORTABLE PELVIS 1 VIEWS COMPARISON:  Right hip radiograph dated 09/22/2023 FINDINGS: Status post intramedullary rod placement within the right femur. Alignment is near anatomic. No acute right hip dislocation. Degenerative changes of the bilateral hips. Overlying subcutaneous edema and emphysema within the right thigh. IMPRESSION: Status post intramedullary rod placement within the right femur. Alignment is near anatomic. Electronically Signed   By: Limin  Xu M.D.   On: 09/23/2023 16:37   DG Pelvis Portable Result Date: 09/23/2023 CLINICAL DATA:  Status post right femoral intramedullary nail placement EXAM: RIGHT FEMUR PORTABLE 2 VIEW; PORTABLE PELVIS 1 VIEWS COMPARISON:  Right hip radiograph dated 09/22/2023 FINDINGS: Status post intramedullary rod placement within the right femur. Alignment is near anatomic.  No acute right hip dislocation. Degenerative changes of the bilateral hips. Overlying subcutaneous edema and emphysema within the right thigh. IMPRESSION: Status post intramedullary rod placement within the right femur. Alignment is near anatomic. Electronically Signed   By: Limin  Xu M.D.   On: 09/23/2023 16:37   DG C-Arm 1-60 Min-No Report Result Date: 09/23/2023 Fluoroscopy was utilized by the requesting physician.  No radiographic interpretation.   DG C-Arm 1-60 Min-No Report Result Date: 09/23/2023 Fluoroscopy was utilized by the requesting physician.  No radiographic interpretation.   DG Chest Portable 1 View Result Date: 09/22/2023 CLINICAL DATA:  Fall EXAM: PORTABLE CHEST 1 VIEW COMPARISON:  03/22/2022 FINDINGS: Limited study as the patient was imaged in the left lateral decubitus position. This causes volume loss with areas of peripheral opacities in the left lung, likely atelectasis. Right lung clear. Heart mediastinal contours within normal limits. No visible pneumothorax or acute bony abnormality. IMPRESSION: Limited study performed in the left lateral decubitus position. Areas of atelectasis in the left lung likely related to positioning. No definite active process. Electronically Signed   By: Janeece Mechanic M.D.   On: 09/22/2023 14:24   DG Hip Unilat W or Wo Pelvis 2-3 Views Right Result Date: 09/22/2023 CLINICAL DATA:  Fall, right hip pain EXAM: DG HIP (WITH OR WITHOUT PELVIS) 2-3V RIGHT COMPARISON:  None Available. FINDINGS: There is a right  femoral intertrochanteric fracture with varus angulation and impaction. No subluxation or dislocation. Mild degenerative changes in the hips. SI joints symmetric and unremarkable. IMPRESSION: Right femoral intertrochanteric fracture with varus angulation and impaction. Electronically Signed   By: Janeece Mechanic M.D.   On: 09/22/2023 14:23    Microbiology: No results found for this or any previous visit.  Labs: CBC: Recent Labs  Lab 09/22/23 1308  09/23/23 0424 09/25/23 0302  WBC 6.6 13.8* 12.7*  NEUTROABS 3.6  --  8.9*  HGB 11.9* 11.2* 9.3*  HCT 36.9 33.6* 28.8*  MCV 98.4 97.4 99.7  PLT 208 199 153   Basic Metabolic Panel: Recent Labs  Lab 09/22/23 1308 09/23/23 0424 09/25/23 0302  NA 140 134* 139  K 3.6 4.0 4.4  CL 108 101 108  CO2 23 26 28   GLUCOSE 120* 155* 107*  BUN 9 15 26*  CREATININE 0.65 0.60 0.58  CALCIUM  8.8* 8.8* 8.7*   Liver Function Tests: No results for input(s): AST, ALT, ALKPHOS, BILITOT, PROT, ALBUMIN in the last 168 hours. CBG: No results for input(s): GLUCAP in the last 168 hours.  Discharge time spent:  34 minutes.  Signed: Ezzard Holms, MD [facility] 09/25/2023

## 2023-09-25 NOTE — Care Management Important Message (Signed)
 Important Message  Patient Details  Name: Cathy Hunt MRN: [id number] Date of Birth: Dec 10, 1950   Important Message Given:  Yes - Medicare IM     Javonta Gronau W, CMA 09/25/2023, 12:35 PM

## 2023-09-25 NOTE — Plan of Care (Signed)

## 2023-09-25 NOTE — Progress Notes (Signed)
 Physical Therapy Treatment Patient Details Name: Cathy Hunt MRN: [id number] DOB: 1950-10-27 Today's Date: 09/25/2023   History of Present Illness Pt is a 73 yo female s/p R hip IM nailing. PMH of hypothyroidism, arthritis, cervical cancer.    PT Comments  Pt was supine in bed upon arrival with supportive daughter at bedside. Pt agrees to session and remains cooperative throughout. Does continue to endorse severe RLE spasms worse than pain, I don't hurt except when having a spasm.  Pt demonstrated safe abilities to exit bed, stand, and ambulate with use of RW. No LOB however extremely slow cadence. Pt is cleared from a acute PT standpoint for safe DC home with HHPT to follow.    If plan is discharge home, recommend the following: A little help with walking and/or transfers;A little help with bathing/dressing/bathroom;Assistance with cooking/housework;Assist for transportation;Help with stairs or ramp for entrance     Equipment Recommendations   (pt has DMEneeds met already)       Precautions / Restrictions Precautions Precautions: Fall Recall of Precautions/Restrictions: Intact Restrictions Weight Bearing Restrictions Per Provider Order: Yes RLE Weight Bearing Per Provider Order: Weight bearing as tolerated     Mobility  Bed Mobility Overal bed mobility: Needs Assistance Bed Mobility: Supine to Sit, Sit to Supine  Supine to sit: Min assist Sit to supine: Min assist General bed mobility comments: for RLE assist    Transfers Overall transfer level: Needs assistance Equipment used: Rolling walker (2 wheels) Transfers: Sit to/from Stand Sit to Stand: Min assist, Contact guard assist  General transfer comment: minA x3 attempts, CGA from recliner once    Ambulation/Gait Ambulation/Gait assistance: Contact guard assist Gait Distance (Feet): 120 Feet Assistive device: Rolling walker (2 wheels) Gait Pattern/deviations: Step-to pattern, Step-through pattern, Trunk flexed   General Gait Details: slow cadence however safe without LOB. continues to be most limited by spasms   Balance Overall balance assessment: Needs assistance Sitting-balance support: Feet supported Sitting balance-Leahy Scale: Good     Standing balance support: Bilateral upper extremity supported, During functional activity Standing balance-Leahy Scale: Fair         Cognition Arousal: Alert Behavior During Therapy: WFL for tasks assessed/performed   PT - Cognitive impairments: No apparent impairments  PT - Cognition Comments: pt is A and O x 4. supportive daughter present                  Pertinent Vitals/Pain Pain Assessment Pain Assessment: 0-10 Pain Score: 4  Pain Location: R hip Pain Descriptors / Indicators: Sore, Aching, Tightness Pain Intervention(s): Limited activity within patient's tolerance, Monitored during session, Premedicated before session, Repositioned     PT Goals (current goals can now be found in the care plan section) Acute Rehab PT Goals Patient Stated Goal: return to 100% PLOF Progress towards PT goals: Progressing toward goals    Frequency    BID       Co-evaluation PT/OT/SLP Co-Evaluation/Treatment: Yes      AM-PAC PT 6 Clicks Mobility   Outcome Measure  Help needed turning from your back to your side while in a flat bed without using bedrails?: A Little Help needed moving from lying on your back to sitting on the side of a flat bed without using bedrails?: A Little Help needed moving to and from a bed to a chair (including a wheelchair)?: A Little Help needed standing up from a chair using your arms (e.g., wheelchair or bedside chair)?: A Little Help needed to walk in hospital room?: A  Little Help needed climbing 3-5 steps with a railing? : A Little 6 Click Score: 18    End of Session   Activity Tolerance: Patient tolerated treatment well Patient left: in chair;with call bell/phone within reach;with chair alarm set;with  family/visitor present   PT Visit Diagnosis: Other abnormalities of gait and mobility (R26.89);Difficulty in walking, not elsewhere classified (R26.2);Muscle weakness (generalized) (M62.81);Pain Pain - Right/Left: Right Pain - part of body: Hip     Time: 0913-1006 PT Time Calculation (min) (ACUTE ONLY): 53 min  Charges:    $Gait Training: 8-22 mins $Therapeutic Exercise: 8-22 mins $Therapeutic Activity: 8-22 mins PT General Charges $$ ACUTE PT VISIT: 1 Visit                     Chester Costa PTA 09/25/23, 11:35 AM

## 2023-09-25 NOTE — Progress Notes (Signed)
 PT Cancellation Note  Patient Details Name: Cathy Hunt MRN: [id number] DOB: 06-Apr-1951   Author returned as requested to review and perform stair training. Pt easily and safely able to ascend/descend step to simulate home entry/exit. Pt is cleared form an acute PT standpoint for safe DC home with daughter assisting as needed.    Koleen Perna 09/25/2023, 12:53 PM

## 2023-09-25 NOTE — Progress Notes (Signed)
 Patient is not able to walk the distance required to go the bathroom, or he/she is unable to safely negotiate stairs required to access the bathroom.  A 3in1 BSC will alleviate this problem

## 2023-09-25 NOTE — Discharge Instructions (Signed)
 [facility] They will call you to set up when they are coming out to see you   [address], [place], [place] [address] Hours:  Open ? Closes 5?PM Phone: [phone]        Instructions after Total Knee Replacement   Reinaldo Berber M.D.     Dept. of Orthopaedics & Sports Medicine  [facility]  [address]  [place], [place]  [address]  Phone: [phone]   Fax: [phone]    DIET: Drink plenty of non-alcoholic fluids. Resume your normal diet. Include foods high in fiber.  ACTIVITY:  You may use crutches or a walker with weight-bearing as tolerated, unless instructed otherwise. You may be weaned off of the walker or crutches by your Physical Therapist.  Do NOT place pillows under the knee. Anything placed under the knee could limit your ability to straighten the knee.   Continue doing gentle exercises. Exercising will reduce the pain and swelling, increase motion, and prevent muscle weakness.   Please continue to use the TED compression stockings for 2 weeks. You may remove the stockings at night, but should reapply them in the morning. Do not drive or operate any equipment until instructed.  WOUND CARE:  Continue to use the PolarCare or ice packs periodically to reduce pain and swelling. You may begin showering 3 days after surgery with honeycomb dressing. Remove honeycomb dressing 7 days after surgery and continue showering. Allow dermabond to fall off on its own.  MEDICATIONS: You may resume your regular medications. Please take the pain medication as prescribed on the medication. Do not take pain medication on an empty stomach. You have been given a prescription for a blood thinner (Lovenox or Coumadin). Please take the medication as instructed. (NOTE: After completing a 2 week course of Lovenox, take one 81 mg Enteric-coated aspirin twice a day for 3 additional weeks. This along with elevation will help reduce the possibility of phlebitis in your operated  leg.) Do not drive or drink alcoholic beverages when taking pain medications.  POSTOPERATIVE CONSTIPATION PROTOCOL Constipation - defined medically as fewer than three stools per week and severe constipation as less than one stool per week.  One of the most common issues patients have following surgery is constipation.  Even if you have a regular bowel pattern at home, your normal regimen is likely to be disrupted due to multiple reasons following surgery.  Combination of anesthesia, postoperative narcotics, change in appetite and fluid intake all can affect your bowels.  In order to avoid complications following surgery, here are some recommendations in order to help you during your recovery period.  Colace (docusate) - Pick up an over-the-counter form of Colace or another stool softener and take twice a day as long as you are requiring postoperative pain medications.  Take with a full glass of water daily.  If you experience loose stools or diarrhea, hold the colace until you stool forms back up.  If your symptoms do not get better within 1 week or if they get worse, check with your doctor.  Dulcolax (bisacodyl) - Pick up over-the-counter and take as directed by the product packaging as needed to assist with the movement of your bowels.  Take with a full glass of water.  Use this product as needed if not relieved by Colace only.   MiraLax (polyethylene glycol) - Pick up over-the-counter to have on hand.  MiraLax is a solution that will increase the amount of water in your bowels to assist with  bowel movements.  Take as directed and can mix with a glass of water, juice, soda, coffee, or tea.  Take if you go more than two days without a movement. Do not use MiraLax more than once per day. Call your doctor if you are still constipated or irregular after using this medication for 7 days in a row.  If you continue to have problems with postoperative constipation, please contact the office for further  assistance and recommendations.  If you experience "the worst abdominal pain ever" or develop nausea or vomiting, please contact the office immediatly for further recommendations for treatment.   CALL THE OFFICE FOR: Temperature above 101 degrees Excessive bleeding or drainage on the dressing. Excessive swelling, coldness, or paleness of the toes. Persistent nausea and vomiting.  FOLLOW-UP:  You should have an appointment to return to the office in 14 days after surgery. Arrangements have been made for continuation of Physical Therapy (either home therapy or outpatient therapy).

## 2023-09-25 NOTE — TOC Transition Note (Signed)
 Transition of Care [facility]) - Discharge Note   Patient Details  Name: Cathy Hunt MRN: [id number] Date of Birth: 1950/07/24  Transition of [facility]) CM/SW Contact:  Alexandra Ice, RN Phone Number: 09/25/2023, 11:12 AM   Clinical Narrative:    Patient to discharge today, home with home health services. Patient has no preference on which agency to use. Sent home health referral to Shaun with [facility]. Sent DME orders to Jon with [facility] for processing. DME will be delivered to bedside. Added agency information to AVS.    Final next level of care: Home w Home Health Services Barriers to Discharge: Barriers Unresolved (comment)   Patient Goals and CMS Choice Patient states their goals for this hospitalization and ongoing recovery are:: get better CMS Medicare.gov Compare Post Acute Care list provided to:: Patient Choice offered to / list presented to : Patient      Discharge Placement                  Name of family member notified: Bernardo Bridgeman Patient and family notified of of transfer: 09/25/23  Discharge Plan and Services Additional resources added to the After Visit Summary for       Post Acute Care Choice: Durable Medical Equipment, Home Health          DME Arranged: Otho Blitz rolling, Bedside commode DME Agency: [facility] Date DME Agency Contacted: 09/25/23 Time DME Agency Contacted: 1109 Representative spoke with at DME Agency: Sam Creighton HH Arranged: PT, OT [facility] Agency: [facility] Date HH Agency Contacted: 09/25/23 Time HH Agency Contacted: 1109 Representative spoke with at [facility] Agency: Shaun  Social Drivers of Health (SDOH) Interventions SDOH Screenings   Food Insecurity: Unknown (09/24/2023)  Housing: Unknown (09/24/2023)  Transportation Needs: Unknown (09/24/2023)  Utilities: Not At Risk (09/24/2023)  Social Connections: Unknown (09/24/2023)  Tobacco Use: Low Risk  (09/23/2023)     Readmission Risk Interventions     No data to display

## 2023-09-29 DIAGNOSIS — E785 Hyperlipidemia, unspecified: Secondary | ICD-10-CM | POA: Diagnosis not present

## 2023-09-29 DIAGNOSIS — I8393 Asymptomatic varicose veins of bilateral lower extremities: Secondary | ICD-10-CM | POA: Diagnosis not present

## 2023-09-29 DIAGNOSIS — M810 Age-related osteoporosis without current pathological fracture: Secondary | ICD-10-CM | POA: Diagnosis not present

## 2023-09-29 DIAGNOSIS — E039 Hypothyroidism, unspecified: Secondary | ICD-10-CM | POA: Diagnosis not present

## 2023-09-29 DIAGNOSIS — Z7982 Long term (current) use of aspirin: Secondary | ICD-10-CM | POA: Diagnosis not present

## 2023-09-29 DIAGNOSIS — S72141D Displaced intertrochanteric fracture of right femur, subsequent encounter for closed fracture with routine healing: Secondary | ICD-10-CM | POA: Diagnosis not present

## 2023-09-29 DIAGNOSIS — Z8541 Personal history of malignant neoplasm of cervix uteri: Secondary | ICD-10-CM | POA: Diagnosis not present

## 2023-09-29 DIAGNOSIS — E440 Moderate protein-calorie malnutrition: Secondary | ICD-10-CM | POA: Diagnosis not present

## 2023-09-29 DIAGNOSIS — Z9181 History of falling: Secondary | ICD-10-CM | POA: Diagnosis not present

## 2023-09-29 DIAGNOSIS — G43909 Migraine, unspecified, not intractable, without status migrainosus: Secondary | ICD-10-CM | POA: Diagnosis not present

## 2023-09-29 DIAGNOSIS — M1990 Unspecified osteoarthritis, unspecified site: Secondary | ICD-10-CM | POA: Diagnosis not present

## 2023-10-07 ENCOUNTER — Telehealth: Payer: Self-pay

## 2023-10-07 ENCOUNTER — Other Ambulatory Visit: Payer: Self-pay

## 2023-10-07 ENCOUNTER — Ambulatory Visit
Admission: RE | Admit: 2023-10-07 | Discharge: 2023-10-07 | Disposition: A | Discharge status: Home / Self Care | Attending: Orthopedic Surgery | Admitting: Orthopedic Surgery

## 2023-10-07 ENCOUNTER — Ambulatory Visit
Admission: RE | Admit: 2023-10-07 | Discharge: 2023-10-07 | Disposition: A | Discharge status: Home / Self Care | Source: Ambulatory Visit | Attending: Orthopedic Surgery | Admitting: Orthopedic Surgery

## 2023-10-07 DIAGNOSIS — M898X5 Other specified disorders of bone, thigh: Secondary | ICD-10-CM | POA: Diagnosis not present

## 2023-10-07 DIAGNOSIS — S72101A Unspecified trochanteric fracture of right femur, initial encounter for closed fracture: Secondary | ICD-10-CM | POA: Diagnosis not present

## 2023-10-07 NOTE — Telephone Encounter (Signed)
 Called patient and gave her the address to go have her x rays done   orders are in

## 2023-10-08 ENCOUNTER — Telehealth: Payer: Self-pay

## 2023-10-08 ENCOUNTER — Ambulatory Visit (INDEPENDENT_AMBULATORY_CARE_PROVIDER_SITE_OTHER): Admitting: Orthopedic Surgery

## 2023-10-08 ENCOUNTER — Encounter: Payer: Self-pay | Admitting: Orthopedic Surgery

## 2023-10-08 DIAGNOSIS — S72141G Displaced intertrochanteric fracture of right femur, subsequent encounter for closed fracture with delayed healing: Secondary | ICD-10-CM

## 2023-10-08 NOTE — Patient Instructions (Signed)
 Can take 1000 mg tylenol  3 times her day  Can alternate with ibuprofen  600 mg up to 3 times a day

## 2023-10-08 NOTE — Telephone Encounter (Signed)
 Patient would like a call back concerning how much Tylenol  and Ibuprofen  she should take when alternating.  CB# [phone].  Please advise.  Thank you.

## 2023-10-08 NOTE — Progress Notes (Signed)
 Orthopaedic Postop Note  Assessment: Cathy Hunt is a 73 y.o. female s/p cephalomedullary nail for Right intertrochanteric femur fracture  DOS: 09/23/2023  Plan: Kurtis removed, steri strips placed Continue with protective WBAT Continue with DVT prophylaxis for at least 6 weeks after surgery WBAT on the operative extremity Follow up in 4 weeks; call with any issues   Follow-up: Return in about 4 weeks (around 11/05/2023). XR at next visit: AP pelvis and Right femur  Subjective:  Chief Complaint  Patient presents with   Routine Post Op    Post op she is having pain and her right knee is hurting really bad  she is icing and taking 1000 mg acetaminophen  3 times a day     History of Present Illness: Cathy Hunt is a 73 y.o. female who presents following the above stated procedure.  She has been discharged home.  She is working with PT and OT.  She is taking tylenol  daily.  No numbness or tingling.  She is having pain in the right knee.   Review of Systems: No fevers or chills No numbness or tingling No Chest Pain No shortness of breath   Objective: There were no vitals taken for this visit.  Physical Exam:  Alert and oriented.  No acute distress.  Seated in wheelchair.  Surgical incisions are healing well.  No surrounding erythema or drainage.  Able to maintain a straight leg raise.  Active motion intact in the TA/EHL.  Toes are warm and well-perfused.   IMAGING: I personally ordered and reviewed the following images:  XR of the Right femur and AP pelvis demonstrates a well positioned cephalomedullary nail.   The intertrochanteric femur fracture remains in stable position.  There is no evidence of implant subsidence.  No acute fractures are noted.  Screws are not backing out.  No concern for screw cutout.   Impression: Right intertrochanteric femur fracture in stable position without evidence of hardware failure or subsidence.   Oneil DELENA Horde, MD 10/08/2023 1:55  PM

## 2023-10-08 NOTE — Telephone Encounter (Signed)
 Called patient and advised her 1000 mg tylenol  3 times a day and ibuprofen  600 mg 3 times a day

## 2023-10-31 DIAGNOSIS — Z0000 Encounter for general adult medical examination without abnormal findings: Secondary | ICD-10-CM | POA: Diagnosis not present

## 2023-11-04 ENCOUNTER — Telehealth: Payer: Self-pay

## 2023-11-04 ENCOUNTER — Other Ambulatory Visit: Payer: Self-pay | Admitting: Orthopedic Surgery

## 2023-11-04 ENCOUNTER — Ambulatory Visit
Admission: RE | Admit: 2023-11-04 | Discharge: 2023-11-04 | Disposition: A | Discharge status: Home / Self Care | Attending: Orthopedic Surgery | Admitting: Orthopedic Surgery

## 2023-11-04 ENCOUNTER — Ambulatory Visit
Admission: RE | Admit: 2023-11-04 | Discharge: 2023-11-04 | Disposition: A | Discharge status: Home / Self Care | Source: Ambulatory Visit | Attending: Orthopedic Surgery | Admitting: Orthopedic Surgery

## 2023-11-04 DIAGNOSIS — S72141G Displaced intertrochanteric fracture of right femur, subsequent encounter for closed fracture with delayed healing: Secondary | ICD-10-CM

## 2023-11-04 DIAGNOSIS — M898X5 Other specified disorders of bone, thigh: Secondary | ICD-10-CM | POA: Insufficient documentation

## 2023-11-04 DIAGNOSIS — S72141A Displaced intertrochanteric fracture of right femur, initial encounter for closed fracture: Secondary | ICD-10-CM | POA: Diagnosis not present

## 2023-11-04 DIAGNOSIS — T797XXA Traumatic subcutaneous emphysema, initial encounter: Secondary | ICD-10-CM | POA: Diagnosis not present

## 2023-11-04 DIAGNOSIS — M25559 Pain in unspecified hip: Secondary | ICD-10-CM | POA: Diagnosis not present

## 2023-11-04 DIAGNOSIS — M160 Bilateral primary osteoarthritis of hip: Secondary | ICD-10-CM | POA: Diagnosis not present

## 2023-11-04 NOTE — Telephone Encounter (Signed)
 Spoke to patient and advised her to go to have xrays before appointment  she knew the address of the [facility] and said she will go   orders are in

## 2023-11-05 ENCOUNTER — Ambulatory Visit (INDEPENDENT_AMBULATORY_CARE_PROVIDER_SITE_OTHER): Admitting: Orthopedic Surgery

## 2023-11-05 ENCOUNTER — Encounter: Payer: Self-pay | Admitting: Orthopedic Surgery

## 2023-11-05 DIAGNOSIS — S72141D Displaced intertrochanteric fracture of right femur, subsequent encounter for closed fracture with routine healing: Secondary | ICD-10-CM

## 2023-11-05 NOTE — Progress Notes (Signed)
 Orthopaedic Postop Note  Assessment: Cathy Hunt is a 73 y.o. female s/p cephalomedullary nail for Right intertrochanteric femur fracture  DOS: 09/23/2023  Plan: Cathy Hunt is recovering very well.  She is ambulating independently with a walker.  Radiographs remain stable.  We reviewed radiographs in clinic today.  I think she is healing this appropriately.  She will continue to work with therapy.  Continue ambulating with the assistance of a walker, and it is okay to transition to a cane.  I would like to see her back in 6 weeks.  She can attempt to drive, but recommend practicing prior to getting on the road.   Follow-up: Return in about 6 weeks (around 12/17/2023). XR at next visit: AP pelvis and Right femur  Subjective:  Chief Complaint  Patient presents with   Hip Pain    6 weeks post op right hip  patient says she is getting there just a lot of brusing  having tightness when using full weight     History of Present Illness: Cathy Hunt is a 73 y.o. female who presents following the above stated procedure.  She is recovering well.  She continues to work with physical therapy.  She notes that therapy has started her using a cane.  For longer distances she is still using a walker.  She notes a lot of bruising in her legs still.  Minimal swelling.  She is not taking medicines on a regular basis.  She does continue to take aspirin .   Review of Systems: No fevers or chills No numbness or tingling No Chest Pain No shortness of breath   Objective: There were no vitals taken for this visit.  Physical Exam:  Alert and oriented.  No acute distress.  Seated in wheelchair.  Surgical incisions of healed.  There is no tenderness.  No surrounding erythema or drainage.  She is able to maintain a straight leg raise.  She is able to actively rotate her hip, with minimal discomfort.  Toes are warm and well-perfused.  Minimal swelling distally.   IMAGING: I personally ordered and  reviewed the following images:  AP pelvis and right femur x-rays were obtained.  These compared to prior x-rays.  Cephalomedullary nail remains in stable alignment.  Fracture has not changed in alignment.  There has been no subsidence.  Screws are not backing out.  Cathy DELENA Horde, MD 11/05/2023 2:04 PM

## 2023-11-06 ENCOUNTER — Encounter: Admitting: Orthopedic Surgery

## 2023-11-07 DIAGNOSIS — Z1211 Encounter for screening for malignant neoplasm of colon: Secondary | ICD-10-CM | POA: Diagnosis not present

## 2023-11-07 DIAGNOSIS — G720 Drug-induced myopathy: Secondary | ICD-10-CM | POA: Diagnosis not present

## 2023-11-07 DIAGNOSIS — Z0000 Encounter for general adult medical examination without abnormal findings: Secondary | ICD-10-CM | POA: Diagnosis not present

## 2023-11-07 DIAGNOSIS — E785 Hyperlipidemia, unspecified: Secondary | ICD-10-CM | POA: Diagnosis not present

## 2023-11-07 DIAGNOSIS — T466X5A Adverse effect of antihyperlipidemic and antiarteriosclerotic drugs, initial encounter: Secondary | ICD-10-CM | POA: Diagnosis not present

## 2023-11-07 DIAGNOSIS — M810 Age-related osteoporosis without current pathological fracture: Secondary | ICD-10-CM | POA: Diagnosis not present

## 2023-11-07 DIAGNOSIS — Z1331 Encounter for screening for depression: Secondary | ICD-10-CM | POA: Diagnosis not present

## 2023-11-07 DIAGNOSIS — Z0001 Encounter for general adult medical examination with abnormal findings: Secondary | ICD-10-CM | POA: Diagnosis not present

## 2023-11-07 DIAGNOSIS — E039 Hypothyroidism, unspecified: Secondary | ICD-10-CM | POA: Diagnosis not present

## 2023-11-19 DIAGNOSIS — Z1211 Encounter for screening for malignant neoplasm of colon: Secondary | ICD-10-CM | POA: Diagnosis not present

## 2023-11-27 LAB — COLOGUARD: COLOGUARD: NEGATIVE

## 2023-12-01 ENCOUNTER — Telehealth: Payer: Self-pay | Admitting: Orthopedic Surgery

## 2023-12-01 MED ORDER — CLINDAMYCIN HCL 300 MG PO CAPS: 300.0000 mg | ORAL_CAPSULE | Freq: Once | ORAL | 0 refills | Status: AC

## 2023-12-01 MED ORDER — CLINDAMYCIN HCL 300 MG PO CAPS: 300.0000 mg | ORAL_CAPSULE | Freq: Once | ORAL | 0 refills | Status: DC

## 2023-12-01 NOTE — Telephone Encounter (Signed)
 One dose clindamycin  600 mg prior to dental procedure sent to pharmacy.

## 2023-12-01 NOTE — Telephone Encounter (Signed)
 Dr. Onesimo pt - pt lvm stating she has a dental appointment today and they need to know if she needs to premed.  [phone]

## 2023-12-03 ENCOUNTER — Other Ambulatory Visit

## 2023-12-03 ENCOUNTER — Ambulatory Visit

## 2023-12-05 DIAGNOSIS — G43909 Migraine, unspecified, not intractable, without status migrainosus: Secondary | ICD-10-CM | POA: Diagnosis not present

## 2023-12-05 DIAGNOSIS — I8393 Asymptomatic varicose veins of bilateral lower extremities: Secondary | ICD-10-CM | POA: Diagnosis not present

## 2023-12-05 DIAGNOSIS — S72141D Displaced intertrochanteric fracture of right femur, subsequent encounter for closed fracture with routine healing: Secondary | ICD-10-CM | POA: Diagnosis not present

## 2023-12-05 DIAGNOSIS — E785 Hyperlipidemia, unspecified: Secondary | ICD-10-CM | POA: Diagnosis not present

## 2023-12-05 DIAGNOSIS — M1990 Unspecified osteoarthritis, unspecified site: Secondary | ICD-10-CM | POA: Diagnosis not present

## 2023-12-05 DIAGNOSIS — E440 Moderate protein-calorie malnutrition: Secondary | ICD-10-CM | POA: Diagnosis not present

## 2023-12-05 DIAGNOSIS — M810 Age-related osteoporosis without current pathological fracture: Secondary | ICD-10-CM | POA: Diagnosis not present

## 2023-12-05 DIAGNOSIS — E039 Hypothyroidism, unspecified: Secondary | ICD-10-CM | POA: Diagnosis not present

## 2023-12-09 DIAGNOSIS — E039 Hypothyroidism, unspecified: Secondary | ICD-10-CM | POA: Diagnosis not present

## 2023-12-10 ENCOUNTER — Inpatient Hospital Stay

## 2023-12-10 ENCOUNTER — Encounter: Payer: Self-pay | Admitting: Obstetrics and Gynecology

## 2023-12-10 ENCOUNTER — Inpatient Hospital Stay: Attending: Obstetrics and Gynecology | Admitting: Obstetrics and Gynecology

## 2023-12-10 VITALS — BP 119/79 | HR 71 | Temp 97.6°F | Resp 20 | Wt 124.7 lb

## 2023-12-10 DIAGNOSIS — R87623 High grade squamous intraepithelial lesion on cytologic smear of vagina (HGSIL): Secondary | ICD-10-CM | POA: Insufficient documentation

## 2023-12-10 DIAGNOSIS — Z9079 Acquired absence of other genital organ(s): Secondary | ICD-10-CM | POA: Insufficient documentation

## 2023-12-10 DIAGNOSIS — Z90722 Acquired absence of ovaries, bilateral: Secondary | ICD-10-CM | POA: Diagnosis not present

## 2023-12-10 DIAGNOSIS — R87811 Vaginal high risk human papillomavirus (HPV) DNA test positive: Secondary | ICD-10-CM | POA: Diagnosis not present

## 2023-12-10 DIAGNOSIS — Z90710 Acquired absence of both cervix and uterus: Secondary | ICD-10-CM | POA: Diagnosis not present

## 2023-12-10 NOTE — Progress Notes (Signed)
 Gynecologic Oncology Consult Visit   Referring Provider: Dr. Lovetta   Chief Complaint: History of vaginal dysplasia Subjective:  Cathy Hunt is a 73 y.o. G2P1 female who is seen in consultation from Dr. Lovetta for HSIL on pap s/p [facility] BS 5/17.   She was last seen by Dr Mancil October 2023 and she was released to gynecology.   01/31/23- ASCUS, HPV Positive 08/15/23- Unsatisfactory, HPV Positive  She was referred back to gyn onc for second opinion.   No symptoms  Gynecologic History:  Cathy Hunt is a pleasant G2P1 female who is seen in consultation from Dr. Lovetta for HSIL on pap s/p [facility] BS 5/17.   CKC in 90s.   2017- ASCUS + HR HPV w/ repeat pap 6 months later with same result.   Repeat colposcopy in 07/2015 showed LGSIL in the ECC. Definitive procedure was recommended and she underwent TVH and BS with Dr. Lovetta on 08/21/2015 for CIN1 with positive ECC.   09/24/16- ASCUS  09/30/17- LSIL  10/14/18- LSIL  10/20/19- HSIL  11/11/19-  Specimen A-Vaginal Biopsy, vaginal cuff 2 o'clock: BENIGN ATROPHIC SQUAMOUS MUCOSA. NEGATIVE FOR DYSPLASIA AND MALIGNANCY.  Specimen B-Vaginal Biopsy, vaginal cuff 3 o'clock: BENIGN ATROPHIC SQUAMOUS MUCOSA. NEGATIVE FOR DYSPLASIA AND MALIGNANCY.  Specimen C-Vaginal Biopsy, vaginal cuff 4 o'clock: TISSUE DID NOT SURVIVE PROCESSING.  She was seen by Dr. Mancil and he recommended repeat Pap with colposcopy in 4 months.   01/12/20 - Colposcopy was negative. She was started on vaginal premarin    05/17/20 PAP LSIL. Colposcopic biopsy A. VAGINA, RIGHT FORNIX; BIOPSY:  - HIGH-GRADE SQUAMOUS INTRAEPITHELIAL LESION (VAIN 2).  5/22- completed 10 weeks of efudex . Tolerated well with some irritation.  05/09/21 NILM- Reactive cellular changes and/or repair are present. Cellular changes associated with atrophy are present. HR HPV positive.   01/23/22- NILM, HPV Negative  Had varicose vein treatment in left lower leg 2022.   Problem  List: Patient Active Problem List   Diagnosis Date Noted   Malnutrition of moderate degree 09/23/2023   Hip fracture, unspecified laterality, closed, initial encounter (HCC) 09/22/2023   Hip fracture (HCC) 09/22/2023   Varicose veins of leg with pain, bilateral 08/18/2020   Vaginal Pap smear with HGSIL 01/17/2020   Hypothyroidism, adult 04/14/2019   Postmenopausal osteoporosis 01/16/2016   Complete tear of left rotator cuff 11/17/2015   Rotator cuff tendinitis, left 11/17/2015   Postoperative state 08/21/2015    Past Medical History: Past Medical History:  Diagnosis Date   Arthritis    Cancer (HCC)    Cervical   Complication of anesthesia    difficulty following verbal commands after vein ligation surgery   Dental crowns present    a lot   Headache    migraines   Hypothyroidism    Insomnia    PONV (postoperative nausea and vomiting)     Past Surgical History: Past Surgical History:  Procedure Laterality Date   APPENDECTOMY     BILATERAL SALPINGECTOMY  08/21/2015   Procedure: BILATERAL SALPINGECTOMY;  Surgeon: Debby PARAS Schermerhorn, MD;  Location: [facility] ORS;  Service: Gynecology;;   BREAST EXCISIONAL BIOPSY Right 2008   neg   BREAST SURGERY Right    Lumpectomy   CERVICAL CONE BIOPSY  1984   COLONOSCOPY WITH PROPOFOL  N/A 03/23/2015   Procedure: COLONOSCOPY WITH PROPOFOL ;  Surgeon: Deward CINDERELLA Piedmont, MD;  Location: [facility];  Service: Gastroenterology;  Laterality: N/A;   DIAGNOSTIC LAPAROSCOPY     INTRAMEDULLARY (IM) NAIL INTERTROCHANTERIC Right 09/23/2023   Procedure: FIXATION,  FRACTURE, INTERTROCHANTERIC, WITH INTRAMEDULLARY ROD;  Surgeon: Onesimo Oneil LABOR, MD;  Location: [facility] ORS;  Service: Orthopedics;  Laterality: Right;   SHOULDER ARTHROSCOPY Left 12/13/2015   Procedure: arthroscopic debridement, arthroscopic subacromial decompression, and mini-open rotator cuff repair, left shoulder;  Surgeon: Norleen JINNY Maltos, MD;  Location: [facility];  Service: Orthopedics;   Laterality: Left;  Patien requests to be first   TONSILLECTOMY     TUBAL LIGATION     VAGINAL HYSTERECTOMY N/A 08/21/2015   Procedure: HYSTERECTOMY VAGINAL;  Surgeon: Debby JINNY Dinsmore, MD;  Location: [facility] ORS;  Service: Gynecology;  Laterality: N/A;   VAGINAL HYSTERECTOMY     VEIN LIGATION AND STRIPPING  1985   Past Gynecologic History:  Post menopausal.  Menarche: age 31 No pmb or hot flashes Not sexually active.  Hx of abnormal pap smears Hx of cone biopsy, tubal ligation, hysterectomy  OB History: . G2P1 OB History  No obstetric history on file.   Family History: Family History  Problem Relation Age of Onset   Heart attack Father 15   Cervical cancer Sister    Breast cancer Neg Hx    Social History: Social History   Socioeconomic History   Marital status: Divorced    Spouse name: Not on file   Number of children: Not on file   Years of education: Not on file   Highest education level: Not on file  Occupational History   Not on file  Tobacco Use   Smoking status: Never   Smokeless tobacco: Never  Vaping Use   Vaping status: Never Used  Substance and Sexual Activity   Alcohol  use: Yes    Comment: social   Drug use: No   Sexual activity: Not on file  Other Topics Concern   Not on file  Social History Narrative   Not on file   Social Drivers of Health   Financial Resource Strain: Not on file  Food Insecurity: Unknown (09/24/2023)   Hunger Vital Sign    Worried About Running Out of Food in the Last Year: Never true    Ran Out of Food in the Last Year: Not on file  Transportation Needs: Unknown (09/24/2023)   PRAPARE - Administrator, Civil Service (Medical): No    Lack of Transportation (Non-Medical): Not on file  Physical Activity: Not on file  Stress: Not on file  Social Connections: Unknown (09/24/2023)   Social Connection and Isolation Panel    Frequency of Communication with Friends and Family: Twice a week    Frequency of Social  Gatherings with Friends and Family: Twice a week    Attends Religious Services: Patient declined    Database administrator or Organizations: Patient declined    Attends Banker Meetings: Patient declined    Marital Status: Patient declined  Intimate Partner Violence: Unknown (09/24/2023)   Humiliation, Afraid, Rape, and Kick questionnaire    Fear of Current or Ex-Partner: No    Emotionally Abused: Not on file    Physically Abused: Not on file    Sexually Abused: Not on file   Immunization History  Administered Date(s) Administered   Influenza Inj Mdck Quad Pf 01/05/2016   Influenza Split 01/05/2015   Influenza-Unspecified 02/05/2017, 01/06/2018   PFIZER(Purple Top)SARS-COV-2 Vaccination 05/23/2019, 06/22/2019   Pneumococcal Conjugate-13 02/08/2016   Pneumococcal Polysaccharide-23 01/05/2015   Allergies: Allergies  Allergen Reactions   Codeine Other (See Comments)    drop in blood pressure   Penicillins Swelling  Phenergan [Promethazine Hcl] Other (See Comments)    drop in blood pressure   Promethazine Other (See Comments)    Low BP   Adhesive [Tape] Rash   Latex Rash   Current Medications: Current Outpatient Medications  Medication Sig Dispense Refill   acetaminophen  (TYLENOL ) 500 MG tablet Take 2 tablets (1,000 mg total) by mouth every 8 (eight) hours as needed. 30 tablet 0   aspirin  EC 81 MG tablet Take 81 mg by mouth daily.     docusate sodium  (COLACE) 100 MG capsule Take 1 capsule (100 mg total) by mouth 2 (two) times daily. (Patient not taking: Reported on 05/09/2021) 30 capsule 0   levothyroxine  (SYNTHROID ) 112 MCG tablet Take 112 mcg by mouth daily.     methocarbamol  (ROBAXIN ) 500 MG tablet Take 0.5 tablets (250 mg total) by mouth every 8 (eight) hours as needed for muscle spasms. 10 tablet 0   Multiple Vitamins-Minerals (HM MULTIVITAMIN ADULT GUMMY) CHEW Chew 1 Dose by mouth every morning.     rosuvastatin  (CRESTOR ) 5 MG tablet Take 1 tablet (5 mg  total) by mouth daily. 30 tablet 6   SUMAtriptan (IMITREX) 50 MG tablet Take 50 mg by mouth.     No current facility-administered medications for this visit.   Review of Systems General:  no complaints Skin: no complaints Eyes: no complaints HEENT: no complaints Breasts: no complaints Pulmonary: no complaints Cardiac: no complaints Gastrointestinal: no complaints Genitourinary/Sexual: no complaints Ob/Gyn: no complaints Musculoskeletal: no complaints Hematology: no complaints Neurologic/Psych: no complaints  Objective:  Physical Examination:  BP 119/79   Pulse 71   Temp 97.6 F (36.4 C)   Resp 20   Wt 124 lb 11.2 oz (56.6 kg)   SpO2 100%   BMI 20.13 kg/m     ECOG Performance Status: 0 - Asymptomatic  GENERAL: Patient is a well appearing female in no acute distress EXTREMITIES:  No peripheral edema.   SKIN:  Clear with no obvious rashes or skin changes.  NEURO:  Nonfocal. Well oriented.  Appropriate affect.  Pelvic: chaperoned by nursing.  EGBUS: no lesions Cervix: surgically absent Vagina:  atrophy, no lesions, no discharge or bleeding. Pap obtained Uterus: surgically absent BME: no palpable masses Rectovaginal:  deferred  Procedure note: Informed consent signed and time out done.  Examination of the vagina with colposcopy after acetic acid was applied did not show any abnormal lesions.     Assessment:  Cathy Hunt is a 73 y.o. female diagnosed with HSIL PAP s/p TVH/BS for CIN1 in 2017. Prior [facility]C in the 1990s.  Had low grade abnormal PAPs after hysterectomy LSIL/ASCUS, but now HSIL PAP 7/21.  Vaginal biopsies x 3 by Dr. Lovetta negative.  S/p vaginal ERT.  Colposcopy c/w VAIN in the right upper vaginal fornix 2/22 and biopsy showed VAIN2. Treated with 10 weeks of Efudex .  PAP 09/20/20 Atypical Squamous Cells of Undetermined Significance  PAP 2/23 normal.  HR HPV +.   PAP 10/24 ASCUS PAP 5/25 unsatisfactory, HR HPV positive  Colposcopy today shows only  atrophy.  PAP repeated since last PAP unsatisfactory in 5/25.  Non smoker and not immunosuppressed.      Plan:   Problem List Items Addressed This Visit       Other   Vaginal Pap smear with HGSIL   Other Visit Diagnoses       Vaginal high risk HPV DNA test positive    -  Primary      Will follow up Pap results.  She will follow up with [facility] in one year or sooner unless this PAP today is significantly abnormal or should any concerning symptoms arise.  We can see her back in the future should the need arise.     The patient's diagnosis, an outline of the further diagnostic and laboratory studies which will be required, the recommendation for surgery, and alternatives were discussed with her and her accompanying family members.  All questions were answered to their satisfaction.  Tinnie KANDICE Dawn, NP  I personally interviewed and examined the patient. Agreed with the above/below plan of care. I have directly contributed to assessment and plan of care of this patient and educated and discussed with patient and family.  Prentice Agent, MD

## 2023-12-10 NOTE — Addendum Note (Signed)
 Addended by: MAURIE QUAKER D on: 12/10/2023 02:02 PM   Modules accepted: Orders

## 2023-12-11 ENCOUNTER — Other Ambulatory Visit: Payer: Self-pay | Admitting: Internal Medicine

## 2023-12-11 DIAGNOSIS — Z1231 Encounter for screening mammogram for malignant neoplasm of breast: Secondary | ICD-10-CM

## 2023-12-12 LAB — IGP, APTIMA HPV: HPV Aptima: POSITIVE — AB

## 2023-12-16 ENCOUNTER — Other Ambulatory Visit: Payer: Self-pay

## 2023-12-16 DIAGNOSIS — S72141D Displaced intertrochanteric fracture of right femur, subsequent encounter for closed fracture with routine healing: Secondary | ICD-10-CM

## 2023-12-17 ENCOUNTER — Ambulatory Visit
Admission: RE | Admit: 2023-12-17 | Discharge: 2023-12-17 | Disposition: A | Discharge status: Home / Self Care | Attending: Orthopedic Surgery | Admitting: Orthopedic Surgery

## 2023-12-17 ENCOUNTER — Ambulatory Visit
Admission: RE | Admit: 2023-12-17 | Discharge: 2023-12-17 | Disposition: A | Discharge status: Home / Self Care | Source: Ambulatory Visit | Attending: Orthopedic Surgery | Admitting: Orthopedic Surgery

## 2023-12-17 DIAGNOSIS — S72141D Displaced intertrochanteric fracture of right femur, subsequent encounter for closed fracture with routine healing: Secondary | ICD-10-CM | POA: Diagnosis not present

## 2023-12-17 DIAGNOSIS — S72141A Displaced intertrochanteric fracture of right femur, initial encounter for closed fracture: Secondary | ICD-10-CM | POA: Diagnosis not present

## 2023-12-17 DIAGNOSIS — M160 Bilateral primary osteoarthritis of hip: Secondary | ICD-10-CM | POA: Diagnosis not present

## 2023-12-17 DIAGNOSIS — M85851 Other specified disorders of bone density and structure, right thigh: Secondary | ICD-10-CM | POA: Diagnosis not present

## 2023-12-18 ENCOUNTER — Ambulatory Visit: Admitting: Orthopedic Surgery

## 2023-12-18 DIAGNOSIS — S72141D Displaced intertrochanteric fracture of right femur, subsequent encounter for closed fracture with routine healing: Secondary | ICD-10-CM

## 2023-12-19 ENCOUNTER — Encounter: Payer: Self-pay | Admitting: Orthopedic Surgery

## 2023-12-19 NOTE — Progress Notes (Signed)
 Orthopaedic Postop Note  Assessment: Cathy Hunt is a 73 y.o. female s/p cephalomedullary nail for Right intertrochanteric femur fracture  DOS: 09/23/2023  Plan: Cathy Hunt has done very well.  She is not quite 3 months out from surgery, and she is walking independently with the use of a cane.  She does have some tenderness and pain laterally, which could be consistent with surgery, or some greater trochanteric bursitis secondary to the prominence of the lag screw.  This was discussed.  In addition, she has some pain of the distal thigh.  Anticipate gradual resolution.  She can work to get off the cane.  She is very motivated, I think this will help her continue to improve.  She is interested in a follow-up appointment in 3 months.  If she has any issues, I can see her sooner.   Follow-up: Return in about 3 months (around 03/18/2024). XR at next visit: AP pelvis and Right femur  Subjective:  Chief Complaint  Patient presents with   Postop    Right intertrochanteric femur fracture, s/p IM nail  DOS: 09/23/2023    History of Present Illness: Cathy Hunt is a 73 y.o. female who presents following the above stated procedure.  Surgery was just under 3 months ago.  She is doing well.  She is ambulating with a cane.  No numbness or tingling.  She does have some pain in the distal thigh, as well as the lateral hip.  No recent injuries.  Patient's.  Review of Systems: No fevers or chills No numbness or tingling No Chest Pain No shortness of breath   Objective: There were no vitals taken for this visit.  Physical Exam:  Alert and oriented.  No acute distress.  Ambulating with a cane  Surgical incisions are healed.  No surrounding erythema or drainage.  Mild tenderness to palpation over the lateral hip.  She is able to maintain straight leg raise.  She tolerates gentle range of motion of the right hip.  IMAGING: I personally ordered and reviewed the following images:  AP pelvis  and right femur x-rays were obtained and compared to prior x-rays.  Fracture remains in unchanged alignment.  There has been interval consolidation.  Fracture line is visible over the lateral cortex, without further displacement.  No evidence of lag screw migration.  Oneil DELENA Horde, MD 12/19/2023 1:07 PM

## 2024-01-07 ENCOUNTER — Ambulatory Visit
Admission: RE | Admit: 2024-01-07 | Discharge: 2024-01-07 | Disposition: A | Discharge status: Home / Self Care | Source: Ambulatory Visit | Attending: Internal Medicine | Admitting: Internal Medicine

## 2024-01-07 DIAGNOSIS — Z1231 Encounter for screening mammogram for malignant neoplasm of breast: Secondary | ICD-10-CM | POA: Diagnosis not present

## 2024-02-09 ENCOUNTER — Encounter: Payer: Self-pay | Admitting: Radiology

## 2024-02-23 DIAGNOSIS — E039 Hypothyroidism, unspecified: Secondary | ICD-10-CM | POA: Diagnosis not present

## 2024-03-01 DIAGNOSIS — M810 Age-related osteoporosis without current pathological fracture: Secondary | ICD-10-CM | POA: Diagnosis not present

## 2024-03-01 DIAGNOSIS — E039 Hypothyroidism, unspecified: Secondary | ICD-10-CM | POA: Diagnosis not present

## 2024-03-01 DIAGNOSIS — Z8781 Personal history of (healed) traumatic fracture: Secondary | ICD-10-CM | POA: Diagnosis not present

## 2024-03-11 ENCOUNTER — Telehealth: Payer: Self-pay

## 2024-03-11 NOTE — Telephone Encounter (Signed)
 I talked to the pt. She wants to know if she needs antibiotics prior to dental procedure still. Please advise

## 2024-03-15 NOTE — Telephone Encounter (Signed)
 I called pt and advised. Note placed in mychart as well

## 2024-03-18 ENCOUNTER — Ambulatory Visit: Admitting: Orthopedic Surgery

## 2024-03-31 ENCOUNTER — Other Ambulatory Visit: Payer: Self-pay | Admitting: Physician Assistant

## 2024-03-31 DIAGNOSIS — M5442 Lumbago with sciatica, left side: Secondary | ICD-10-CM

## 2024-04-04 ENCOUNTER — Ambulatory Visit
Admission: RE | Admit: 2024-04-04 | Discharge: 2024-04-04 | Disposition: A | Discharge status: Home / Self Care | Source: Ambulatory Visit | Attending: Physician Assistant | Admitting: Physician Assistant

## 2024-04-04 DIAGNOSIS — M5442 Lumbago with sciatica, left side: Secondary | ICD-10-CM | POA: Diagnosis present

## 2024-04-22 NOTE — Progress Notes (Addendum)
 "  Referring Physician:  Suzzane Charleston, PA-C [address] [facility] [address] [place],  [place] [address]  Primary Physician:  Rudolpho Norleen BIRCH, MD  History of Present Illness: 04/28/2024 [name] has a history of hypothyroidism, osteoporosis, cervical CA.   She has 2 month history of constant left sided LBP with left lateral leg pain to her knee and into her left groin. No right leg pain. She has weakness in left leg that is worse with walking. No numbness or tingling. Pain is better with laying down with pillow between her knees.   Of note, she had fall this summer and had right hip surgery. Did great with this and was getting back to her normal activity.   No relief with chiropractor. No relief with prednisone, vicodin, robaxin . She is on reclast for her osteoporosis.   Tobacco use: Does not smoke.   Bowel/Bladder Dysfunction: none  Conservative measures: chiropractor Physical therapy:  has not participated in for this pain Multimodal medical therapy including regular antiinflammatories:  Prednisone, Hydrocodone , Robaxin , Tylenol , Ibuprofen  Injections:  no epidural steroid injections  Past Surgery: no spine surgeries  Cathy Hunt has no symptoms of cervical myelopathy.  The symptoms are causing a significant impact on the patient's life.   Review of Systems:  A 10 point review of systems is negative, except for the pertinent positives and negatives detailed in the HPI.  Past Medical History: Past Medical History:  Diagnosis Date   Arthritis    Cancer (HCC)    Cervical   Complication of anesthesia    difficulty following verbal commands after vein ligation surgery   Dental crowns present    a lot   Headache    migraines   Hypothyroidism    Insomnia    PONV (postoperative nausea and vomiting)     Past Surgical History: Past Surgical History:  Procedure Laterality Date   APPENDECTOMY     BILATERAL SALPINGECTOMY  08/21/2015    Procedure: BILATERAL SALPINGECTOMY;  Surgeon: Debby PARAS Schermerhorn, MD;  Location: [facility];  Service: Gynecology;;   BREAST EXCISIONAL BIOPSY Right 2008   neg   BREAST SURGERY Right    Lumpectomy   CERVICAL CONE BIOPSY  1984   COLONOSCOPY WITH PROPOFOL  N/A 03/23/2015   Procedure: COLONOSCOPY WITH PROPOFOL ;  Surgeon: Deward CINDERELLA Piedmont, MD;  Location: [facility];  Service: Gastroenterology;  Laterality: N/A;   DIAGNOSTIC LAPAROSCOPY     INTRAMEDULLARY (IM) NAIL INTERTROCHANTERIC Right 09/23/2023   Procedure: FIXATION, FRACTURE, INTERTROCHANTERIC, WITH INTRAMEDULLARY ROD;  Surgeon: Onesimo Oneil LABOR, MD;  Location: [facility];  Service: Orthopedics;  Laterality: Right;   SHOULDER ARTHROSCOPY Left 12/13/2015   Procedure: arthroscopic debridement, arthroscopic subacromial decompression, and mini-open rotator cuff repair, left shoulder;  Surgeon: Norleen PARAS Maltos, MD;  Location: [facility];  Service: Orthopedics;  Laterality: Left;  Patien requests to be first   TONSILLECTOMY     TUBAL LIGATION     VAGINAL HYSTERECTOMY N/A 08/21/2015   Procedure: HYSTERECTOMY VAGINAL;  Surgeon: Debby PARAS Dinsmore, MD;  Location: [facility];  Service: Gynecology;  Laterality: N/A;   VAGINAL HYSTERECTOMY     VEIN LIGATION AND STRIPPING  1985    Allergies: Allergies as of 04/28/2024 - Review Complete 04/28/2024  Allergen Reaction Noted   Codeine Other (See Comments) 03/22/2015   Penicillins Swelling 03/22/2015   Phenergan [promethazine hcl] Other (See Comments) 03/22/2015   Promethazine Other (See Comments) 06/22/2014   Adhesive [tape] Rash 08/15/2015   Latex Rash 08/15/2015  Medications: Outpatient Encounter Medications as of 04/28/2024  Medication Sig   acetaminophen  (TYLENOL ) 500 MG tablet Take 2 tablets (1,000 mg total) by mouth every 8 (eight) hours as needed.   aspirin  EC 81 MG tablet Take 81 mg by mouth daily.   levothyroxine  (SYNTHROID ) 112 MCG tablet Take 112 mcg by mouth daily.   rosuvastatin   (CRESTOR ) 5 MG tablet Take 1 tablet (5 mg total) by mouth daily.   SUMAtriptan (IMITREX) 50 MG tablet Take 50 mg by mouth.   [DISCONTINUED] docusate sodium  (COLACE) 100 MG capsule Take 1 capsule (100 mg total) by mouth 2 (two) times daily. (Patient not taking: Reported on 12/10/2023)   [DISCONTINUED] methocarbamol  (ROBAXIN ) 500 MG tablet Take 0.5 tablets (250 mg total) by mouth every 8 (eight) hours as needed for muscle spasms. (Patient not taking: Reported on 12/10/2023)   [DISCONTINUED] Multiple Vitamins-Minerals (HM MULTIVITAMIN ADULT GUMMY) CHEW Chew 1 Dose by mouth every morning. (Patient not taking: Reported on 12/10/2023)   No facility-administered encounter medications on file as of 04/28/2024.    Social History: Social History[1]  Family Medical History: Family History  Problem Relation Age of Onset   Heart attack Father 36   Cervical cancer Sister    Breast cancer Neg Hx     Physical Examination: Vitals:   04/28/24 0857  BP: 118/78    General: Patient is well developed, well nourished, calm, collected, and in no apparent distress. Attention to examination is appropriate.  Respiratory: Patient is breathing without any difficulty.   NEUROLOGICAL:     Awake, alert, oriented to person, place, and time.  Speech is clear and fluent. Fund of knowledge is appropriate.   Cranial Nerves: Pupils equal round and reactive to light.  Facial tone is symmetric.    Left sided lower posterior lumbar tenderness.   No abnormal lesions on exposed skin.   Strength: Side Biceps Triceps Deltoid Interossei Grip Wrist Ext. Wrist Flex.  R 5 5 5 5 5 5 5   L 5 5 5 5 5 5 5    Side Iliopsoas Quads Hamstring PF DF EHL  R 5 5 5 5 5 5   L 4-  5 5 5 5 5    Reflexes are 3+ and symmetric at the biceps, brachioradialis, patella, 1+ in bilateral achilles.   Hoffman's is absent.  Clonus is not present.   Bilateral upper and lower extremity sensation is intact to light touch.     No pain with IR/ER of  right hip. She has pain with IR/ER of left hip.   She walks with a cane and a limp favoring left leg.    Medical Decision Making  Imaging: Lumbar MRI dated 04/04/24:  FINDINGS: Segmentation: Presumed standard anatomy with the inferior-most well developed disc space designated as L5-S1.   Alignment:  Physiologic.   Vertebrae: Chronic mild superior endplate compression fracture at L2 without associated bone marrow edema. No acute fractures. No evidence of discitis. No marrow replacing bone lesion.   Conus medullaris and cauda equina: Conus extends to the T12-L1 level. Conus and cauda equina appear normal.   Paraspinal and other soft tissues: Negative.   Disc levels:   T12-L1: Mild disc bulge and facet hypertrophy. No foraminal or canal stenosis.   L1-L2: Mild disc bulge and facet hypertrophy. No foraminal or canal stenosis.   L2-L3: Annular disc bulge with mild facet hypertrophy and ligamentum flavum buckling. Mild right subarticular recess stenosis without canal stenosis. No significant foraminal stenosis.   L3-L4: Annular disc bulge with  mild-to-moderate facet hypertrophy and ligamentum flavum buckling. Moderate canal stenosis with bilateral subarticular recess stenosis. Moderate-severe right and moderate left foraminal stenosis.   L4-L5: Annular disc bulge with moderate facet hypertrophy and ligamentum flavum buckling. Moderate to severe canal stenosis with bilateral subarticular recess stenosis. Moderate to severe bilateral foraminal stenosis, left worse than right.   L5-S1: Disc bulge and endplate spurring, eccentric to the right. Mild facet hypertrophy. No canal stenosis. Moderate bilateral foraminal stenosis.   IMPRESSION: 1. Multilevel lumbar spondylosis, most pronounced at the L4-5 level where there is moderate-to-severe canal stenosis and bilateral foraminal stenosis. 2. Moderate canal stenosis at L3-L4 with moderate-severe right and moderate left  foraminal stenosis. 3. Moderate bilateral foraminal stenosis at L5-S1. 4. Chronic mild superior endplate compression fracture at L2.     Electronically Signed   By: Mabel Converse D.O.   On: 04/14/2024 11:01   Lumbar xrays dated 03/31/24:  DDD L5-S1, umbar spondylosis,  and superior endplate deformity L2    I have personally reviewed the images and agree with the above interpretation.  Assessment and Plan: Ms. Dorrough has a 2 month history of constant left sided LBP with left lateral leg pain to her knee and into her left groin. No right leg pain. She has weakness in left leg that is worse with walking. No numbness or tingling.   Of note, she had fall this summer and had right hip surgery. Did great with this and was getting back to her normal activity.   She has known chronic superior endplate compression of L2, moderate central stenosis L3-L4 with moderate/severe right and moderate left foraminal stenosis, moderate/severe central stenosis L4-L5 with moderate/severe bilateral foraminal stenosis, and moderate bilateral foraminal stenosis L5-S1.   She has weakness 4-/5 in left iliopsoas. She has pain with IR/ER of her left hip.   Treatment options discussed with patient and following plan made:   - Recommend she follow up with Dr. Clois to discuss further options. I am concerned about her left leg weakness.  - Will get left hip xrays on her way out and message her with results.  - She will stop OTC Motrin  and try mobic  daily.Reviewed dosing and side effects. Take with food.  - No relief with muscle relaxers. She does not do well with pain medications. May consider neurontin if no help with mobic ?   I spent a total of 45 minutes in face-to-face and non-face-to-face activities related to this patient's care today including review of outside records, review of imaging, review of symptoms, physical exam, discussion of differential diagnosis, discussion of treatment options, and  documentation.   Thank you for involving me in the care of this patient.   ADDENDUM 04/28/24:  Left hip xrays dated 04/28/24:  FINDINGS: No acute fracture or dislocation. Moderate osteoarthritis of the left hip with joint space narrowing and osteophytosis. Partially visualized ORIF of the right proximal femur with evidence of interval healing. Sacroiliac joints and pubic symphysis are anatomically aligned.   IMPRESSION: 1. No acute osseous abnormality. 2. Moderate osteoarthritis of the left hip. 3. Partially visualized ORIF of the right proximal femur with evidence of interval healing.     Electronically Signed   By: Harrietta Sherry M.D.   On: 04/28/2024 10:46     Will review with Dr. Clois to see if he wants her to see ortho for left hip as well. Message sent to patient. Will follow up when I hear back from him.   ADDENDUM 05/04/24:  Dr. Clois recommends  she see ortho fro her left hip weakness. I called patient to let her know. Dr. Onesimo did her right hip surgery and she would like to see him in [place]. Will have staff schedule this appointment.   Glade Boys PA-C Dept. of Neurosurgery      [1]  Social History Tobacco Use   Smoking status: Never   Smokeless tobacco: Never  Vaping Use   Vaping status: Never Used  Substance Use Topics   Alcohol  use: Yes    Comment: social   Drug use: No   "

## 2024-04-23 ENCOUNTER — Inpatient Hospital Stay
Admission: RE | Admit: 2024-04-23 | Discharge: 2024-04-23 | Disposition: A | Discharge status: Home / Self Care | Payer: Self-pay | Source: Ambulatory Visit | Attending: Orthopedic Surgery | Admitting: Orthopedic Surgery

## 2024-04-23 ENCOUNTER — Other Ambulatory Visit: Payer: Self-pay

## 2024-04-23 DIAGNOSIS — Z049 Encounter for examination and observation for unspecified reason: Secondary | ICD-10-CM

## 2024-04-26 ENCOUNTER — Other Ambulatory Visit: Payer: Self-pay

## 2024-04-26 ENCOUNTER — Inpatient Hospital Stay
Admission: RE | Admit: 2024-04-26 | Discharge: 2024-04-26 | Disposition: A | Payer: Self-pay | Source: Ambulatory Visit | Attending: Orthopedic Surgery | Admitting: Orthopedic Surgery

## 2024-04-26 DIAGNOSIS — Z049 Encounter for examination and observation for unspecified reason: Secondary | ICD-10-CM

## 2024-04-27 ENCOUNTER — Inpatient Hospital Stay
Admission: RE | Admit: 2024-04-27 | Discharge: 2024-04-27 | Disposition: A | Payer: Self-pay | Source: Ambulatory Visit | Attending: Orthopedic Surgery | Admitting: Orthopedic Surgery

## 2024-04-27 ENCOUNTER — Other Ambulatory Visit: Payer: Self-pay

## 2024-04-27 DIAGNOSIS — Z049 Encounter for examination and observation for unspecified reason: Secondary | ICD-10-CM

## 2024-04-28 ENCOUNTER — Encounter: Payer: Self-pay | Admitting: Orthopedic Surgery

## 2024-04-28 ENCOUNTER — Ambulatory Visit: Admitting: Orthopedic Surgery

## 2024-04-28 ENCOUNTER — Ambulatory Visit

## 2024-04-28 VITALS — BP 118/78 | Ht 66.0 in | Wt 125.0 lb

## 2024-04-28 DIAGNOSIS — M48062 Spinal stenosis, lumbar region with neurogenic claudication: Secondary | ICD-10-CM

## 2024-04-28 DIAGNOSIS — M4726 Other spondylosis with radiculopathy, lumbar region: Secondary | ICD-10-CM | POA: Diagnosis not present

## 2024-04-28 DIAGNOSIS — M25552 Pain in left hip: Secondary | ICD-10-CM

## 2024-04-28 DIAGNOSIS — M5416 Radiculopathy, lumbar region: Secondary | ICD-10-CM

## 2024-04-28 DIAGNOSIS — M47816 Spondylosis without myelopathy or radiculopathy, lumbar region: Secondary | ICD-10-CM

## 2024-04-28 DIAGNOSIS — R29898 Other symptoms and signs involving the musculoskeletal system: Secondary | ICD-10-CM

## 2024-04-28 MED ORDER — MELOXICAM 15 MG PO TABS: 15.0000 mg | ORAL_TABLET | Freq: Every day | ORAL | 0 refills | Status: AC

## 2024-04-28 NOTE — Patient Instructions (Signed)
 It was so nice to see you today. Thank you so much for coming in.    You have some wear and tear in your back with spinal stenosis (pressure on spinal cord). You have an old fracture at L2- I don't think this is causing any of your pain.   I think your left leg pain and weakness in likely from your back, but I want to get an xray of your hip as well.   I will message you with these results.   I sent a prescription for meloxicam  to help with pain and inflammation. Take as directed with food. Stop the motrin .   Keep your follow up with Dr. Clois to discuss further options.   Please do not hesitate to call if you have any questions or concerns. You can also message me in MyChart.   Glade Boys PA-C [phone]     The physicians and staff at [facility] Neurosurgery at [facility] are committed to providing excellent care. You may receive a survey asking for feedback about your experience at our office. We value you your feedback and appreciate you taking the time to to fill it out. The [facility] leadership team is also available to discuss your experience in person, feel free to contact us  [phone].

## 2024-05-04 ENCOUNTER — Telehealth: Payer: Self-pay | Admitting: Orthopedic Surgery

## 2024-05-04 NOTE — Telephone Encounter (Signed)
 She has seen Dr. Onesimo for her right hip.   Please schedule her to see him in [place] for left hip weakness. Would like her to be seen as soon as possible.   Thanks!

## 2024-05-06 NOTE — Progress Notes (Signed)
 "   Referring Physician:  Rudolpho Norleen BIRCH, MD [address] [facility] [address] [facility] [place],  [place] [address]  Primary Physician:  Rudolpho Norleen BIRCH, MD  History of Present Illness: 05/11/2024 [name] is here today with a chief complaint of left groin pain and left buttock pain.  She gets pain with walking.  She gets pain with walking upstairs.   She has weakness with hip flexion.  She sometimes get pain down the lateral aspect of her left  The symptoms are causing a significant impact on the patient's life.   I have utilized the care everywhere function in epic to review the outside records available from external health systems.  Progress Note from Glade Boys, [place] on 04/28/24:  History of Present Illness: 04/28/2024 [name] has a history of hypothyroidism, osteoporosis, cervical CA.    She has 2 month history of constant left sided LBP with left lateral leg pain to her knee and into her left groin. No right leg pain. She has weakness in left leg that is worse with walking. No numbness or tingling. Pain is better with laying down with pillow between her knees.    Of note, she had fall this summer and had right hip surgery. Did great with this and was getting back to her normal activity.    No relief with chiropractor. No relief with prednisone, vicodin, robaxin . She is on reclast for her osteoporosis.    Tobacco use: Does not smoke.    Bowel/Bladder Dysfunction: none   Conservative measures: chiropractor Physical therapy:  has not participated in for this pain Multimodal medical therapy including regular antiinflammatories:  Prednisone, Hydrocodone , Robaxin , Tylenol , Ibuprofen  Injections:  no epidural steroid injections   Past Surgery: no spine surgeries  Review of Systems:  A 10 point review of systems is negative, except for the pertinent positives and negatives detailed in the HPI.  Past Medical History: Past Medical History:  Diagnosis Date   Arthritis     Cancer (HCC)    Cervical   Complication of anesthesia    difficulty following verbal commands after vein ligation surgery   Dental crowns present    a lot   Headache    migraines   Hypothyroidism    Insomnia    PONV (postoperative nausea and vomiting)     Past Surgical History: Past Surgical History:  Procedure Laterality Date   APPENDECTOMY     BILATERAL SALPINGECTOMY  08/21/2015   Procedure: BILATERAL SALPINGECTOMY;  Surgeon: Debby PARAS Schermerhorn, MD;  Location: [facility];  Service: Gynecology;;   BREAST EXCISIONAL BIOPSY Right 2008   neg   BREAST SURGERY Right    Lumpectomy   CERVICAL CONE BIOPSY  1984   COLONOSCOPY WITH PROPOFOL  N/A 03/23/2015   Procedure: COLONOSCOPY WITH PROPOFOL ;  Surgeon: Deward CINDERELLA Piedmont, MD;  Location: [facility];  Service: Gastroenterology;  Laterality: N/A;   DIAGNOSTIC LAPAROSCOPY     INTRAMEDULLARY (IM) NAIL INTERTROCHANTERIC Right 09/23/2023   Procedure: FIXATION, FRACTURE, INTERTROCHANTERIC, WITH INTRAMEDULLARY ROD;  Surgeon: Onesimo Oneil LABOR, MD;  Location: [facility];  Service: Orthopedics;  Laterality: Right;   SHOULDER ARTHROSCOPY Left 12/13/2015   Procedure: arthroscopic debridement, arthroscopic subacromial decompression, and mini-open rotator cuff repair, left shoulder;  Surgeon: Norleen PARAS Maltos, MD;  Location: [facility];  Service: Orthopedics;  Laterality: Left;  Patien requests to be first   TONSILLECTOMY     TUBAL LIGATION     VAGINAL HYSTERECTOMY N/A 08/21/2015   Procedure: HYSTERECTOMY VAGINAL;  Surgeon: Debby JINNY Dinsmore, MD;  Location: [facility];  Service: Gynecology;  Laterality: N/A;   VAGINAL HYSTERECTOMY     VEIN LIGATION AND STRIPPING  1985    Allergies: Allergies as of 05/11/2024 - Review Complete 05/11/2024  Allergen Reaction Noted   Codeine Other (See Comments) 03/22/2015   Phenergan [promethazine hcl] Other (See Comments) 03/22/2015   Promethazine Other (See Comments) 06/22/2014   Adhesive [tape] Rash 08/15/2015    Latex Rash 08/15/2015   Penicillins Swelling and Rash 03/22/2015    Medications: Current Medications[1]  Social History: Social History[2]  Family Medical History: Family History  Problem Relation Age of Onset   Heart attack Father 67   Cervical cancer Sister    Breast cancer Neg Hx     Physical Examination: Vitals:   05/11/24 1444  BP: 120/80    General: Patient is in no apparent distress. Attention to examination is appropriate.  Neck:   Supple.  Full range of motion.  Respiratory: Patient is breathing without any difficulty.   NEUROLOGICAL:     Awake, alert, oriented to person, place, and time.  Speech is clear and fluent.   Cranial Nerves: Pupils equal round and reactive to light.  Facial tone is symmetric.  Facial sensation is symmetric. Shoulder shrug is symmetric. Tongue protrusion is midline.  There is no pronator drift.  Strength: Side Biceps Triceps Deltoid Interossei Grip Wrist Ext. Wrist Flex.  R 5 5 5 5 5 5 5   L 5 5 5 5 5 5 5    Side Iliopsoas Quads Hamstring PF DF EHL  R 5 5 5 5 5 5   L 4- 5 5 5 5 5    Reflexes are 2+ and symmetric at the biceps, triceps, brachioradialis, patella and achilles.   Hoffman's is absent.   Bilateral upper and lower extremity sensation is intact to light touch.    No evidence of dysmetria noted.  Gait is antalgic and requires a cane.  FABER testing is positive on the left.     Medical Decision Making  Imaging: MRI L spine 04/04/2024 IMPRESSION: 1. Multilevel lumbar spondylosis, most pronounced at the L4-5 level where there is moderate-to-severe canal stenosis and bilateral foraminal stenosis. 2. Moderate canal stenosis at L3-L4 with moderate-severe right and moderate left foraminal stenosis. 3. Moderate bilateral foraminal stenosis at L5-S1. 4. Chronic mild superior endplate compression fracture at L2.     Electronically Signed   By: Mabel Converse D.O.   On: 04/14/2024 11:01  I have personally  reviewed the images and agree with the above interpretation.  Assessment and Plan: Cathy Hunt is a pleasant 74 y.o. female with left groin pain as well as possible neurogenic claudication due to multilevel lumbar stenosis.  I am concerned that she may have osteoarthritis impacting her left hip.  I would like to connect with her orthopedic surgeon and have her evaluated for possible hip pathology.      Thank you for involving me in the care of this patient.      Naphtali Zywicki K. Clois MD, [facility] Neurosurgery     [1]  Current Outpatient Medications:    acetaminophen  (TYLENOL ) 500 MG tablet, Take 2 tablets (1,000 mg total) by mouth every 8 (eight) hours as needed., Disp: 30 tablet, Rfl: 0   aspirin  EC 81 MG tablet, Take 81 mg by mouth daily., Disp: , Rfl:    levothyroxine  (SYNTHROID ) 112 MCG tablet, Take 112 mcg by mouth daily., Disp: , Rfl:    meloxicam  (MOBIC ) 15 MG  tablet, Take 1 tablet (15 mg total) by mouth daily. Take with food., Disp: 30 tablet, Rfl: 0   rosuvastatin  (CRESTOR ) 5 MG tablet, Take 1 tablet (5 mg total) by mouth daily., Disp: 30 tablet, Rfl: 6   SUMAtriptan (IMITREX) 50 MG tablet, Take 50 mg by mouth., Disp: , Rfl:  [2]  Social History Tobacco Use   Smoking status: Never   Smokeless tobacco: Never  Vaping Use   Vaping status: Never Used  Substance Use Topics   Alcohol  use: Yes    Comment: social   Drug use: No   "

## 2024-05-11 ENCOUNTER — Encounter: Payer: Self-pay | Admitting: Neurosurgery

## 2024-05-11 ENCOUNTER — Ambulatory Visit: Admitting: Neurosurgery

## 2024-05-11 VITALS — BP 120/80 | Wt 122.4 lb

## 2024-05-11 DIAGNOSIS — M48061 Spinal stenosis, lumbar region without neurogenic claudication: Secondary | ICD-10-CM

## 2024-05-11 DIAGNOSIS — R1032 Left lower quadrant pain: Secondary | ICD-10-CM

## 2024-05-11 DIAGNOSIS — M48062 Spinal stenosis, lumbar region with neurogenic claudication: Secondary | ICD-10-CM

## 2024-05-11 DIAGNOSIS — M25552 Pain in left hip: Secondary | ICD-10-CM | POA: Diagnosis not present

## 2024-05-19 ENCOUNTER — Ambulatory Visit: Admitting: Physician Assistant

## 2024-06-03 ENCOUNTER — Ambulatory Visit: Admitting: Orthopedic Surgery
